# Patient Record
Sex: Female | Born: 1945 | Race: White | Hispanic: No
Health system: Midwestern US, Academic
[De-identification: ages and names within clinical notes are randomized; demographics above are authoritative.]

---

## 2017-06-20 ENCOUNTER — Encounter: Admit: 2017-06-20 | Discharge: 2017-06-20 | Payer: BC Managed Care – PPO

## 2017-06-20 ENCOUNTER — Ambulatory Visit: Admit: 2017-06-20 | Discharge: 2017-06-20 | Payer: BC Managed Care – PPO

## 2017-06-20 DIAGNOSIS — M720 Palmar fascial fibromatosis [Dupuytren]: Principal | ICD-10-CM

## 2017-06-20 NOTE — Progress Notes
Date of Service: 06/20/2017    Subjective:               History of Present Illness  71 year old female with Dupuytren's nodules of the left palm and and right small finger.  She has no issues with wrist pain. ECU tendinosis has resolved. She notes increased pain on the left MP joint, as well as new nodule over small finger on the left. She would like to have these injected with steroids.     Review of Systems   Constitutional: Negative.    HENT: Negative.    Eyes: Negative.    Respiratory: Negative.    Cardiovascular: Negative.    Gastrointestinal: Negative.    Endocrine: Negative.    Genitourinary: Negative.    Musculoskeletal: Negative.         As per HPI   Skin: Negative.    Allergic/Immunologic: Negative.    Neurological: Negative.    Hematological: Negative.    Psychiatric/Behavioral: Negative.    All other systems reviewed and are negative.      Objective:         ??? ACCU-CHEK AVIVA CONNECT METER kit USE AS DIRECTED TO CHECK BLOOD GLUCOSE DAILY   ??? ACCU-CHEK AVIVA PLUS TEST STRP test strip USE TO CHECK BLOOD GLUCOSE DAILY   ??? ascorbic acid (VITAMIN C) 500 mg tablet Daily   ??? aspirin EC 81 mg tablet Daily   ??? atorvastatin (LIPITOR) 20 mg tablet Daily   ??? bumetanide (BUMEX) 1 mg tablet Take  by mouth.   ??? Cyanocobalamin 1,000 mcg subl Daily   ??? diclofenac(+) (VOLTAREN) 1 % topical gel Four Times Daily   ??? fluticasone (FLONASE) 50 mcg/actuation nasal spray Daily   ??? gabapentin (NEURONTIN) 600 mg tablet Take As Directed   ??? levothyroxine (SYNTHROID) 100 mcg tablet    ??? lisinopril (PRINIVIL; ZESTRIL) 10 mg tablet Take  by mouth.   ??? loratadine (CLARITIN) 10 mg tablet Take  by mouth.   ??? metFORMIN-XR(+) (GLUCOPHAGE XR) 500 mg extended release tablet    ??? nitroglycerin (NITROSTAT) 0.4 mg tablet As Directed for For Chest Pain   ??? oxybutynin XL (DITROPAN XL) 5 mg tablet    ??? pramipexole (MIRAPEX) 0.5 mg tablet      Vitals:    06/20/17 1443   BP: 122/67   Pulse: 57   Weight: 84.8 kg (187 lb)   Height: 157.5 cm (62) Body mass index is 34.2 kg/m???.     Physical Exam   Constitutional: She is oriented to person, place, and time. She appears well-developed and well-nourished.   HENT:   Head: Normocephalic and atraumatic.   Eyes: Conjunctivae are normal.   Cardiovascular: Normal rate.    Pulmonary/Chest: Effort normal.   Musculoskeletal:   Stable left palmar nodules/cords. Able to fully extend all digits. WWP. SILT.   No pain/tenderness ulnar wrist. TTP L small finger just proximal to MP joint.     Neurological: She is alert and oriented to person, place, and time.   Skin: Skin is warm and dry.   Psychiatric: She has a normal mood and affect. Her behavior is normal. Judgment and thought content normal.   Nursing note and vitals reviewed.    Awake and alert, NAD  Breathing comfortably on RA  Well perfused    Assessment and Plan:  56F with Dupuytren's disease. Steroid injections performed today. She tolerated the procedure well. There were no complications.  20 mg right ulnar side small finger, 10  mg ulnar side mp joint, small finger. F/U in 6 months, sooner if problems or issues arise. All of her questions were answered.

## 2017-06-20 NOTE — Procedures
The risks of steroid injection were extensively discussed with the patient.  These include but are not limited to pain, scarring, soft tissue atrophy/thinning, skin color changes and hypopigmentation, skin quality changes and telengectasias, injury to nearby structures such as vessels, and nerves, tendon injury and tendon rupture and need for further procedures. Nerve injury may result in temporary or permanent numbness. Vessel injury may result in hand or finger ischemia.   The patient understood these and wished to proceed. A witnessed written consent was obtained.      In an aseptic fashion, a total of  20 mg right ulnar side small finger, 10 mg ulnar side mp joint, small finger.     The kenalog 40 was mixed in a 1:1 fashion with 1% lidocaine. There were no complications. A sterile dressing was applied.    The patient tolerated the procedure well. Post-injection expectations, limitations of the injection and care of the extremity were discussed.  Follow up plan was reviewed.

## 2017-08-13 ENCOUNTER — Encounter: Admit: 2017-08-13 | Discharge: 2017-08-14 | Payer: BC Managed Care – PPO

## 2017-09-21 ENCOUNTER — Encounter: Admit: 2017-09-21 | Discharge: 2017-09-22 | Payer: BC Managed Care – PPO

## 2017-10-05 ENCOUNTER — Encounter: Admit: 2017-10-05 | Discharge: 2017-10-06 | Payer: BC Managed Care – PPO

## 2017-10-08 ENCOUNTER — Encounter: Admit: 2017-10-08 | Discharge: 2017-10-09 | Payer: BC Managed Care – PPO

## 2017-10-11 ENCOUNTER — Encounter: Admit: 2017-10-11 | Discharge: 2017-10-11 | Payer: BC Managed Care – PPO

## 2017-10-12 ENCOUNTER — Encounter: Admit: 2017-10-12 | Discharge: 2017-10-12 | Payer: BC Managed Care – PPO

## 2017-10-12 DIAGNOSIS — C50912 Malignant neoplasm of unspecified site of left female breast: Principal | ICD-10-CM

## 2017-10-12 DIAGNOSIS — D0512 Intraductal carcinoma in situ of left breast: ICD-10-CM

## 2017-10-14 ENCOUNTER — Encounter: Admit: 2017-10-14 | Discharge: 2017-10-14 | Payer: BC Managed Care – PPO

## 2017-10-14 DIAGNOSIS — M720 Palmar fascial fibromatosis [Dupuytren]: Principal | ICD-10-CM

## 2017-10-16 ENCOUNTER — Encounter: Admit: 2017-10-16 | Discharge: 2017-10-17 | Payer: BC Managed Care – PPO

## 2017-10-16 ENCOUNTER — Encounter: Admit: 2017-10-16 | Discharge: 2017-10-16 | Payer: BC Managed Care – PPO

## 2017-10-16 DIAGNOSIS — C50412 Malignant neoplasm of upper-outer quadrant of left female breast: Principal | ICD-10-CM

## 2017-10-16 DIAGNOSIS — B029 Zoster without complications: ICD-10-CM

## 2017-10-16 DIAGNOSIS — D869 Sarcoidosis, unspecified: ICD-10-CM

## 2017-10-16 DIAGNOSIS — Z170 Estrogen receptor positive status [ER+]: ICD-10-CM

## 2017-10-16 DIAGNOSIS — C50912 Malignant neoplasm of unspecified site of left female breast: ICD-10-CM

## 2017-10-16 DIAGNOSIS — D0512 Intraductal carcinoma in situ of left breast: ICD-10-CM

## 2017-10-16 DIAGNOSIS — N3941 Urge incontinence: ICD-10-CM

## 2017-10-16 DIAGNOSIS — H547 Unspecified visual loss: ICD-10-CM

## 2017-10-16 DIAGNOSIS — R931 Abnormal findings on diagnostic imaging of heart and coronary circulation: ICD-10-CM

## 2017-10-16 DIAGNOSIS — M25561 Pain in right knee: ICD-10-CM

## 2017-10-16 DIAGNOSIS — G4730 Sleep apnea, unspecified: ICD-10-CM

## 2017-10-16 DIAGNOSIS — K760 Fatty (change of) liver, not elsewhere classified: ICD-10-CM

## 2017-10-16 DIAGNOSIS — Z01818 Encounter for other preprocedural examination: Secondary | ICD-10-CM

## 2017-10-16 DIAGNOSIS — I10 Essential (primary) hypertension: ICD-10-CM

## 2017-10-16 DIAGNOSIS — J329 Chronic sinusitis, unspecified: ICD-10-CM

## 2017-10-16 DIAGNOSIS — E069 Thyroiditis, unspecified: ICD-10-CM

## 2017-10-16 DIAGNOSIS — M549 Dorsalgia, unspecified: ICD-10-CM

## 2017-10-16 DIAGNOSIS — N200 Calculus of kidney: ICD-10-CM

## 2017-10-16 DIAGNOSIS — E039 Hypothyroidism, unspecified: ICD-10-CM

## 2017-10-16 DIAGNOSIS — E119 Type 2 diabetes mellitus without complications: ICD-10-CM

## 2017-10-16 DIAGNOSIS — C50919 Malignant neoplasm of unspecified site of unspecified female breast: ICD-10-CM

## 2017-10-16 DIAGNOSIS — M545 Low back pain: ICD-10-CM

## 2017-10-16 DIAGNOSIS — M720 Palmar fascial fibromatosis [Dupuytren]: Principal | ICD-10-CM

## 2017-10-16 LAB — COMPREHENSIVE METABOLIC PANEL
Lab: 0.4 mg/dL (ref 0.3–1.2)
Lab: 102 MMOL/L (ref 98–110)
Lab: 11 U/L (ref 7–56)
Lab: 135 MMOL/L — ABNORMAL LOW (ref 137–147)
Lab: 16 U/L (ref 7–40)
Lab: 20 mg/dL (ref 7–25)
Lab: 3 K/UL (ref 3–12)
Lab: 3.9 g/dL (ref 3.5–5.0)
Lab: 30 MMOL/L (ref 21–30)
Lab: 4.2 MMOL/L — ABNORMAL LOW (ref 3.5–5.1)
Lab: 65 U/L (ref 25–110)
Lab: 9.3 mg/dL (ref 8.5–10.6)
Lab: 96 mg/dL (ref 70–100)
Lab: >60 mL/min (ref >60)
Lab: >60 mL/min (ref >60)

## 2017-10-16 LAB — CBC AND DIFF
Lab: 0.1 10*3/uL (ref 0–0.20)
Lab: 3.9 M/UL — ABNORMAL LOW (ref 4.0–5.0)
Lab: 8 10*3/uL (ref 4.5–11.0)

## 2017-10-16 MED ORDER — CEFAZOLIN INJ 1GM IVP
2 g | Freq: Once | INTRAVENOUS | 0 refills | Status: CN
Start: 2017-10-16 — End: ?

## 2017-10-17 ENCOUNTER — Encounter: Admit: 2017-10-17 | Discharge: 2017-10-17 | Payer: BC Managed Care – PPO

## 2017-10-17 DIAGNOSIS — C50912 Malignant neoplasm of unspecified site of left female breast: Principal | ICD-10-CM

## 2017-10-17 DIAGNOSIS — C50412 Malignant neoplasm of upper-outer quadrant of left female breast: Principal | ICD-10-CM

## 2017-10-18 ENCOUNTER — Encounter: Admit: 2017-10-18 | Discharge: 2017-10-18 | Payer: BC Managed Care – PPO

## 2017-10-19 ENCOUNTER — Encounter: Admit: 2017-10-19 | Discharge: 2017-10-19 | Payer: BC Managed Care – PPO

## 2017-10-19 ENCOUNTER — Ambulatory Visit: Admit: 2017-10-19 | Discharge: 2017-10-19 | Payer: BC Managed Care – PPO

## 2017-10-19 ENCOUNTER — Ambulatory Visit: Admit: 2017-10-19 | Discharge: 2017-10-20 | Payer: BC Managed Care – PPO

## 2017-10-19 DIAGNOSIS — C50912 Malignant neoplasm of unspecified site of left female breast: ICD-10-CM

## 2017-10-19 DIAGNOSIS — I10 Essential (primary) hypertension: ICD-10-CM

## 2017-10-19 DIAGNOSIS — C50919 Malignant neoplasm of unspecified site of unspecified female breast: ICD-10-CM

## 2017-10-19 DIAGNOSIS — E069 Thyroiditis, unspecified: ICD-10-CM

## 2017-10-19 DIAGNOSIS — E039 Hypothyroidism, unspecified: ICD-10-CM

## 2017-10-19 DIAGNOSIS — Z803 Family history of malignant neoplasm of breast: ICD-10-CM

## 2017-10-19 DIAGNOSIS — B029 Zoster without complications: ICD-10-CM

## 2017-10-19 DIAGNOSIS — C50412 Malignant neoplasm of upper-outer quadrant of left female breast: Principal | ICD-10-CM

## 2017-10-19 DIAGNOSIS — Z170 Estrogen receptor positive status [ER+]: ICD-10-CM

## 2017-10-19 DIAGNOSIS — M25561 Pain in right knee: ICD-10-CM

## 2017-10-19 DIAGNOSIS — H547 Unspecified visual loss: ICD-10-CM

## 2017-10-19 DIAGNOSIS — M720 Palmar fascial fibromatosis [Dupuytren]: Principal | ICD-10-CM

## 2017-10-19 DIAGNOSIS — D869 Sarcoidosis, unspecified: ICD-10-CM

## 2017-10-19 DIAGNOSIS — E119 Type 2 diabetes mellitus without complications: ICD-10-CM

## 2017-10-19 DIAGNOSIS — R931 Abnormal findings on diagnostic imaging of heart and coronary circulation: ICD-10-CM

## 2017-10-19 DIAGNOSIS — N3941 Urge incontinence: ICD-10-CM

## 2017-10-19 DIAGNOSIS — N200 Calculus of kidney: ICD-10-CM

## 2017-10-19 DIAGNOSIS — K760 Fatty (change of) liver, not elsewhere classified: ICD-10-CM

## 2017-10-19 DIAGNOSIS — G4730 Sleep apnea, unspecified: ICD-10-CM

## 2017-10-19 DIAGNOSIS — J329 Chronic sinusitis, unspecified: ICD-10-CM

## 2017-10-19 DIAGNOSIS — M549 Dorsalgia, unspecified: ICD-10-CM

## 2017-10-19 DIAGNOSIS — M545 Low back pain: ICD-10-CM

## 2017-10-19 LAB — MISC REFERENCE TEST

## 2017-10-19 MED ORDER — LIDOCAINE 1% (BUFFERED) SYRINGE (BREAST CENTER)
2 mL | Freq: Once | INTRAMUSCULAR | 0 refills | Status: CP
Start: 2017-10-19 — End: ?

## 2017-10-22 ENCOUNTER — Ambulatory Visit: Admit: 2017-10-22 | Discharge: 2017-10-23 | Payer: BC Managed Care – PPO

## 2017-10-22 ENCOUNTER — Encounter: Admit: 2017-10-22 | Discharge: 2017-10-22 | Payer: BC Managed Care – PPO

## 2017-10-22 DIAGNOSIS — B029 Zoster without complications: ICD-10-CM

## 2017-10-22 DIAGNOSIS — M1990 Unspecified osteoarthritis, unspecified site: ICD-10-CM

## 2017-10-22 DIAGNOSIS — G4733 Obstructive sleep apnea (adult) (pediatric): ICD-10-CM

## 2017-10-22 DIAGNOSIS — R112 Nausea with vomiting, unspecified: ICD-10-CM

## 2017-10-22 DIAGNOSIS — K760 Fatty (change of) liver, not elsewhere classified: ICD-10-CM

## 2017-10-22 DIAGNOSIS — M47812 Spondylosis without myelopathy or radiculopathy, cervical region: ICD-10-CM

## 2017-10-22 DIAGNOSIS — R931 Abnormal findings on diagnostic imaging of heart and coronary circulation: ICD-10-CM

## 2017-10-22 DIAGNOSIS — N3941 Urge incontinence: ICD-10-CM

## 2017-10-22 DIAGNOSIS — I5189 Other ill-defined heart diseases: ICD-10-CM

## 2017-10-22 DIAGNOSIS — M5020 Other cervical disc displacement, unspecified cervical region: ICD-10-CM

## 2017-10-22 DIAGNOSIS — E785 Hyperlipidemia, unspecified: ICD-10-CM

## 2017-10-22 DIAGNOSIS — J479 Bronchiectasis, uncomplicated: ICD-10-CM

## 2017-10-22 DIAGNOSIS — I10 Essential (primary) hypertension: ICD-10-CM

## 2017-10-22 DIAGNOSIS — D869 Sarcoidosis, unspecified: ICD-10-CM

## 2017-10-22 DIAGNOSIS — M545 Low back pain: ICD-10-CM

## 2017-10-22 DIAGNOSIS — M25561 Pain in right knee: ICD-10-CM

## 2017-10-22 DIAGNOSIS — E039 Hypothyroidism, unspecified: ICD-10-CM

## 2017-10-22 DIAGNOSIS — J329 Chronic sinusitis, unspecified: ICD-10-CM

## 2017-10-22 DIAGNOSIS — C50919 Malignant neoplasm of unspecified site of unspecified female breast: ICD-10-CM

## 2017-10-22 DIAGNOSIS — E119 Type 2 diabetes mellitus without complications: ICD-10-CM

## 2017-10-22 DIAGNOSIS — M720 Palmar fascial fibromatosis [Dupuytren]: Principal | ICD-10-CM

## 2017-10-22 DIAGNOSIS — E069 Thyroiditis, unspecified: ICD-10-CM

## 2017-10-22 DIAGNOSIS — H547 Unspecified visual loss: ICD-10-CM

## 2017-10-22 DIAGNOSIS — N200 Calculus of kidney: ICD-10-CM

## 2017-10-23 ENCOUNTER — Encounter: Admit: 2017-10-23 | Discharge: 2017-10-23 | Payer: BC Managed Care – PPO

## 2017-10-23 DIAGNOSIS — Z01810 Encounter for preprocedural cardiovascular examination: Principal | ICD-10-CM

## 2017-10-24 ENCOUNTER — Encounter: Admit: 2017-10-24 | Discharge: 2017-10-24 | Payer: BC Managed Care – PPO

## 2017-10-24 ENCOUNTER — Ambulatory Visit: Admit: 2017-10-24 | Discharge: 2017-10-24 | Payer: BC Managed Care – PPO

## 2017-10-24 ENCOUNTER — Ambulatory Visit: Admit: 2017-10-22 | Discharge: 2017-10-22 | Payer: BC Managed Care – PPO

## 2017-10-24 DIAGNOSIS — D869 Sarcoidosis, unspecified: ICD-10-CM

## 2017-10-24 DIAGNOSIS — E069 Thyroiditis, unspecified: ICD-10-CM

## 2017-10-24 DIAGNOSIS — Z170 Estrogen receptor positive status [ER+]: ICD-10-CM

## 2017-10-24 DIAGNOSIS — Z800 Family history of malignant neoplasm of digestive organs: ICD-10-CM

## 2017-10-24 DIAGNOSIS — Z881 Allergy status to other antibiotic agents status: ICD-10-CM

## 2017-10-24 DIAGNOSIS — I5189 Other ill-defined heart diseases: ICD-10-CM

## 2017-10-24 DIAGNOSIS — G4733 Obstructive sleep apnea (adult) (pediatric): ICD-10-CM

## 2017-10-24 DIAGNOSIS — E039 Hypothyroidism, unspecified: ICD-10-CM

## 2017-10-24 DIAGNOSIS — Z803 Family history of malignant neoplasm of breast: ICD-10-CM

## 2017-10-24 DIAGNOSIS — Z8249 Family history of ischemic heart disease and other diseases of the circulatory system: ICD-10-CM

## 2017-10-24 DIAGNOSIS — Z885 Allergy status to narcotic agent status: ICD-10-CM

## 2017-10-24 DIAGNOSIS — M545 Low back pain: ICD-10-CM

## 2017-10-24 DIAGNOSIS — M5020 Other cervical disc displacement, unspecified cervical region: ICD-10-CM

## 2017-10-24 DIAGNOSIS — M25561 Pain in right knee: ICD-10-CM

## 2017-10-24 DIAGNOSIS — C50919 Malignant neoplasm of unspecified site of unspecified female breast: ICD-10-CM

## 2017-10-24 DIAGNOSIS — I10 Essential (primary) hypertension: ICD-10-CM

## 2017-10-24 DIAGNOSIS — E119 Type 2 diabetes mellitus without complications: ICD-10-CM

## 2017-10-24 DIAGNOSIS — M47812 Spondylosis without myelopathy or radiculopathy, cervical region: ICD-10-CM

## 2017-10-24 DIAGNOSIS — E785 Hyperlipidemia, unspecified: ICD-10-CM

## 2017-10-24 DIAGNOSIS — N200 Calculus of kidney: ICD-10-CM

## 2017-10-24 DIAGNOSIS — M1990 Unspecified osteoarthritis, unspecified site: ICD-10-CM

## 2017-10-24 DIAGNOSIS — H547 Unspecified visual loss: ICD-10-CM

## 2017-10-24 DIAGNOSIS — C50412 Malignant neoplasm of upper-outer quadrant of left female breast: Principal | ICD-10-CM

## 2017-10-24 DIAGNOSIS — M720 Palmar fascial fibromatosis [Dupuytren]: Principal | ICD-10-CM

## 2017-10-24 DIAGNOSIS — B029 Zoster without complications: ICD-10-CM

## 2017-10-24 DIAGNOSIS — R112 Nausea with vomiting, unspecified: ICD-10-CM

## 2017-10-24 DIAGNOSIS — N3941 Urge incontinence: ICD-10-CM

## 2017-10-24 DIAGNOSIS — J479 Bronchiectasis, uncomplicated: ICD-10-CM

## 2017-10-24 DIAGNOSIS — R931 Abnormal findings on diagnostic imaging of heart and coronary circulation: ICD-10-CM

## 2017-10-24 DIAGNOSIS — J329 Chronic sinusitis, unspecified: ICD-10-CM

## 2017-10-24 DIAGNOSIS — K760 Fatty (change of) liver, not elsewhere classified: Secondary | ICD-10-CM

## 2017-10-24 LAB — POC GLUCOSE
Lab: 99 mg/dL (ref 70–100)
Lab: 99 mg/dL (ref 70–100)

## 2017-10-24 MED ORDER — PROPOFOL 10 MG/ML IV EMUL 50 ML (INFUSION)(AM)(OR)
INTRAVENOUS | 0 refills | Status: DC
Start: 2017-10-24 — End: 2017-10-24
  Administered 2017-10-24: 15:00:00 125 ug/kg/min via INTRAVENOUS

## 2017-10-24 MED ORDER — MIDAZOLAM 1 MG/ML IJ SOLN
INTRAVENOUS | 0 refills | Status: DC
Start: 2017-10-24 — End: 2017-10-24
  Administered 2017-10-24: 15:00:00 2 mg via INTRAVENOUS

## 2017-10-24 MED ORDER — OXYCODONE-ACETAMINOPHEN 5-325 MG PO TAB
1-2 | ORAL | 0 refills | Status: DC | PRN
Start: 2017-10-24 — End: 2017-10-24

## 2017-10-24 MED ORDER — LIDOCAINE (PF) 10 MG/ML (1 %) IJ SOLN
.1-2 mL | Freq: Once | INTRAMUSCULAR | 0 refills | Status: CP
Start: 2017-10-24 — End: ?

## 2017-10-24 MED ORDER — SENNOSIDES 8.6 MG PO TAB
2 | ORAL_TABLET | Freq: Every evening | ORAL | 1 refills | Status: AC | PRN
Start: 2017-10-24 — End: 2017-11-06

## 2017-10-24 MED ORDER — FENTANYL CITRATE (PF) 50 MCG/ML IJ SOLN
50 ug | INTRAVENOUS | 0 refills | Status: DC | PRN
Start: 2017-10-24 — End: 2017-10-24

## 2017-10-24 MED ORDER — LACTATED RINGERS IV SOLP
INTRAVENOUS | 0 refills | Status: DC
Start: 2017-10-24 — End: 2017-10-24
  Administered 2017-10-24: 15:00:00 1000.000 mL via INTRAVENOUS

## 2017-10-24 MED ORDER — LIDOCAINE-EPINEPHRINE 1 %-1:100,000 IJ SOLN
0 refills | Status: DC
Start: 2017-10-24 — End: 2017-10-24
  Administered 2017-10-24: 16:00:00 13 mL via INTRAMUSCULAR

## 2017-10-24 MED ORDER — PROMETHAZINE 25 MG/ML IJ SOLN
6.25 mg | INTRAVENOUS | 0 refills | Status: DC | PRN
Start: 2017-10-24 — End: 2017-10-24

## 2017-10-24 MED ORDER — OXYCODONE-ACETAMINOPHEN 5-325 MG PO TAB
1 | ORAL_TABLET | ORAL | 0 refills | 2.00000 days | Status: AC | PRN
Start: 2017-10-24 — End: 2017-11-06
  Filled 2017-10-24 (×2): qty 32, 7d supply, fill #1

## 2017-10-24 MED ORDER — CEFAZOLIN INJ 1GM IVP
2 g | Freq: Once | INTRAVENOUS | 0 refills | Status: CP
Start: 2017-10-24 — End: ?
  Administered 2017-10-24: 15:00:00 2 g via INTRAVENOUS

## 2017-10-24 MED ORDER — FENTANYL CITRATE (PF) 50 MCG/ML IJ SOLN
25 ug | INTRAVENOUS | 0 refills | Status: DC | PRN
Start: 2017-10-24 — End: 2017-10-24

## 2017-10-24 MED ORDER — HALOPERIDOL LACTATE 5 MG/ML IJ SOLN
1 mg | Freq: Once | INTRAVENOUS | 0 refills | Status: DC | PRN
Start: 2017-10-24 — End: 2017-10-24

## 2017-10-24 MED ORDER — HYDROCODONE-ACETAMINOPHEN 5-325 MG PO TAB
1 | ORAL_TABLET | ORAL | 0 refills | Status: CN | PRN
Start: 2017-10-24 — End: ?

## 2017-10-24 MED ORDER — BUPIVACAINE 0.25 % (2.5 MG/ML) IJ SOLN
0 refills | Status: DC
Start: 2017-10-24 — End: 2017-10-24
  Administered 2017-10-24: 16:00:00 20 mL via INTRAMUSCULAR

## 2017-10-24 MED ORDER — LIDOCAINE (PF) 200 MG/10 ML (2 %) IJ SYRG
0 refills | Status: DC
Start: 2017-10-24 — End: 2017-10-24
  Administered 2017-10-24: 15:00:00 50 mg via INTRAVENOUS

## 2017-10-24 MED ORDER — MEPERIDINE (PF) 25 MG/ML IJ SYRG
12.5 mg | INTRAVENOUS | 0 refills | Status: DC | PRN
Start: 2017-10-24 — End: 2017-10-24

## 2017-10-24 MED ADMIN — LIDOCAINE (PF) 10 MG/ML (1 %) IJ SOLN [95838]: 0.2 mL | INTRAMUSCULAR | @ 14:00:00 | Stop: 2017-10-24 | NDC 63323049227

## 2017-10-26 ENCOUNTER — Encounter: Admit: 2017-10-26 | Discharge: 2017-10-26 | Payer: BC Managed Care – PPO

## 2017-10-26 DIAGNOSIS — B029 Zoster without complications: ICD-10-CM

## 2017-10-26 DIAGNOSIS — M5020 Other cervical disc displacement, unspecified cervical region: ICD-10-CM

## 2017-10-26 DIAGNOSIS — K760 Fatty (change of) liver, not elsewhere classified: ICD-10-CM

## 2017-10-26 DIAGNOSIS — M47812 Spondylosis without myelopathy or radiculopathy, cervical region: ICD-10-CM

## 2017-10-26 DIAGNOSIS — E039 Hypothyroidism, unspecified: Secondary | ICD-10-CM

## 2017-10-26 DIAGNOSIS — M720 Palmar fascial fibromatosis [Dupuytren]: Principal | ICD-10-CM

## 2017-10-26 DIAGNOSIS — J479 Bronchiectasis, uncomplicated: ICD-10-CM

## 2017-10-26 DIAGNOSIS — M25561 Pain in right knee: ICD-10-CM

## 2017-10-26 DIAGNOSIS — D869 Sarcoidosis, unspecified: ICD-10-CM

## 2017-10-26 DIAGNOSIS — N3941 Urge incontinence: ICD-10-CM

## 2017-10-26 DIAGNOSIS — I10 Essential (primary) hypertension: ICD-10-CM

## 2017-10-26 DIAGNOSIS — E785 Hyperlipidemia, unspecified: ICD-10-CM

## 2017-10-26 DIAGNOSIS — R931 Abnormal findings on diagnostic imaging of heart and coronary circulation: ICD-10-CM

## 2017-10-26 DIAGNOSIS — M545 Low back pain: ICD-10-CM

## 2017-10-26 DIAGNOSIS — E119 Type 2 diabetes mellitus without complications: ICD-10-CM

## 2017-10-26 DIAGNOSIS — E069 Thyroiditis, unspecified: ICD-10-CM

## 2017-10-26 DIAGNOSIS — G4733 Obstructive sleep apnea (adult) (pediatric): ICD-10-CM

## 2017-10-26 DIAGNOSIS — I5189 Other ill-defined heart diseases: ICD-10-CM

## 2017-10-26 DIAGNOSIS — J329 Chronic sinusitis, unspecified: ICD-10-CM

## 2017-10-26 DIAGNOSIS — H547 Unspecified visual loss: ICD-10-CM

## 2017-10-26 DIAGNOSIS — R112 Nausea with vomiting, unspecified: ICD-10-CM

## 2017-10-26 DIAGNOSIS — C50919 Malignant neoplasm of unspecified site of unspecified female breast: ICD-10-CM

## 2017-10-26 DIAGNOSIS — M1990 Unspecified osteoarthritis, unspecified site: ICD-10-CM

## 2017-10-26 DIAGNOSIS — N200 Calculus of kidney: ICD-10-CM

## 2017-10-30 ENCOUNTER — Ambulatory Visit: Admit: 2017-10-30 | Discharge: 2017-11-13 | Payer: BC Managed Care – PPO

## 2017-10-30 DIAGNOSIS — Z170 Estrogen receptor positive status [ER+]: Secondary | ICD-10-CM

## 2017-11-01 ENCOUNTER — Encounter: Admit: 2017-11-01 | Discharge: 2017-11-01 | Payer: BC Managed Care – PPO

## 2017-11-06 ENCOUNTER — Encounter: Admit: 2017-11-06 | Discharge: 2017-11-06 | Payer: BC Managed Care – PPO

## 2017-11-06 DIAGNOSIS — I10 Essential (primary) hypertension: ICD-10-CM

## 2017-11-06 DIAGNOSIS — H547 Unspecified visual loss: ICD-10-CM

## 2017-11-06 DIAGNOSIS — M47812 Spondylosis without myelopathy or radiculopathy, cervical region: ICD-10-CM

## 2017-11-06 DIAGNOSIS — Z79811 Long term (current) use of aromatase inhibitors: ICD-10-CM

## 2017-11-06 DIAGNOSIS — B029 Zoster without complications: ICD-10-CM

## 2017-11-06 DIAGNOSIS — R931 Abnormal findings on diagnostic imaging of heart and coronary circulation: ICD-10-CM

## 2017-11-06 DIAGNOSIS — E039 Hypothyroidism, unspecified: Secondary | ICD-10-CM

## 2017-11-06 DIAGNOSIS — M1990 Unspecified osteoarthritis, unspecified site: ICD-10-CM

## 2017-11-06 DIAGNOSIS — Z9889 Other specified postprocedural states: ICD-10-CM

## 2017-11-06 DIAGNOSIS — D869 Sarcoidosis, unspecified: ICD-10-CM

## 2017-11-06 DIAGNOSIS — C50412 Malignant neoplasm of upper-outer quadrant of left female breast: Principal | ICD-10-CM

## 2017-11-06 DIAGNOSIS — K760 Fatty (change of) liver, not elsewhere classified: ICD-10-CM

## 2017-11-06 DIAGNOSIS — M25561 Pain in right knee: ICD-10-CM

## 2017-11-06 DIAGNOSIS — N3941 Urge incontinence: ICD-10-CM

## 2017-11-06 DIAGNOSIS — Z170 Estrogen receptor positive status [ER+]: ICD-10-CM

## 2017-11-06 DIAGNOSIS — M545 Low back pain: ICD-10-CM

## 2017-11-06 DIAGNOSIS — M5020 Other cervical disc displacement, unspecified cervical region: ICD-10-CM

## 2017-11-06 DIAGNOSIS — J479 Bronchiectasis, uncomplicated: ICD-10-CM

## 2017-11-06 DIAGNOSIS — E069 Thyroiditis, unspecified: ICD-10-CM

## 2017-11-06 DIAGNOSIS — R112 Nausea with vomiting, unspecified: ICD-10-CM

## 2017-11-06 DIAGNOSIS — Z09 Encounter for follow-up examination after completed treatment for conditions other than malignant neoplasm: ICD-10-CM

## 2017-11-06 DIAGNOSIS — J329 Chronic sinusitis, unspecified: ICD-10-CM

## 2017-11-06 DIAGNOSIS — C50919 Malignant neoplasm of unspecified site of unspecified female breast: ICD-10-CM

## 2017-11-06 DIAGNOSIS — E119 Type 2 diabetes mellitus without complications: ICD-10-CM

## 2017-11-06 DIAGNOSIS — M720 Palmar fascial fibromatosis [Dupuytren]: Principal | ICD-10-CM

## 2017-11-06 DIAGNOSIS — E785 Hyperlipidemia, unspecified: ICD-10-CM

## 2017-11-06 DIAGNOSIS — I5189 Other ill-defined heart diseases: ICD-10-CM

## 2017-11-06 DIAGNOSIS — G4733 Obstructive sleep apnea (adult) (pediatric): ICD-10-CM

## 2017-11-06 DIAGNOSIS — N200 Calculus of kidney: ICD-10-CM

## 2017-11-06 MED ORDER — ANASTROZOLE 1 MG PO TAB
1 mg | ORAL_TABLET | Freq: Every day | ORAL | 3 refills | 33.00000 days | Status: AC
Start: 2017-11-06 — End: 2018-12-25

## 2017-11-12 ENCOUNTER — Encounter: Admit: 2017-11-12 | Discharge: 2017-11-12 | Payer: BC Managed Care – PPO

## 2017-11-12 ENCOUNTER — Ambulatory Visit: Admit: 2017-11-12 | Discharge: 2017-11-12 | Payer: BC Managed Care – PPO

## 2017-11-12 DIAGNOSIS — J479 Bronchiectasis, uncomplicated: ICD-10-CM

## 2017-11-12 DIAGNOSIS — E039 Hypothyroidism, unspecified: ICD-10-CM

## 2017-11-12 DIAGNOSIS — M47812 Spondylosis without myelopathy or radiculopathy, cervical region: ICD-10-CM

## 2017-11-12 DIAGNOSIS — M1990 Unspecified osteoarthritis, unspecified site: ICD-10-CM

## 2017-11-12 DIAGNOSIS — C50919 Malignant neoplasm of unspecified site of unspecified female breast: ICD-10-CM

## 2017-11-12 DIAGNOSIS — E069 Thyroiditis, unspecified: ICD-10-CM

## 2017-11-12 DIAGNOSIS — R931 Abnormal findings on diagnostic imaging of heart and coronary circulation: ICD-10-CM

## 2017-11-12 DIAGNOSIS — R112 Nausea with vomiting, unspecified: ICD-10-CM

## 2017-11-12 DIAGNOSIS — G4733 Obstructive sleep apnea (adult) (pediatric): ICD-10-CM

## 2017-11-12 DIAGNOSIS — D869 Sarcoidosis, unspecified: ICD-10-CM

## 2017-11-12 DIAGNOSIS — M5020 Other cervical disc displacement, unspecified cervical region: ICD-10-CM

## 2017-11-12 DIAGNOSIS — I5189 Other ill-defined heart diseases: ICD-10-CM

## 2017-11-12 DIAGNOSIS — N3941 Urge incontinence: ICD-10-CM

## 2017-11-12 DIAGNOSIS — M720 Palmar fascial fibromatosis [Dupuytren]: Principal | ICD-10-CM

## 2017-11-12 DIAGNOSIS — J329 Chronic sinusitis, unspecified: ICD-10-CM

## 2017-11-12 DIAGNOSIS — B029 Zoster without complications: ICD-10-CM

## 2017-11-12 DIAGNOSIS — E785 Hyperlipidemia, unspecified: ICD-10-CM

## 2017-11-12 DIAGNOSIS — N200 Calculus of kidney: ICD-10-CM

## 2017-11-12 DIAGNOSIS — C50412 Malignant neoplasm of upper-outer quadrant of left female breast: Principal | ICD-10-CM

## 2017-11-12 DIAGNOSIS — M25561 Pain in right knee: ICD-10-CM

## 2017-11-12 DIAGNOSIS — I10 Essential (primary) hypertension: ICD-10-CM

## 2017-11-12 DIAGNOSIS — H547 Unspecified visual loss: ICD-10-CM

## 2017-11-12 DIAGNOSIS — E119 Type 2 diabetes mellitus without complications: ICD-10-CM

## 2017-11-12 DIAGNOSIS — M545 Low back pain: ICD-10-CM

## 2017-11-12 DIAGNOSIS — K760 Fatty (change of) liver, not elsewhere classified: Secondary | ICD-10-CM

## 2017-11-13 DIAGNOSIS — Z170 Estrogen receptor positive status [ER+]: ICD-10-CM

## 2017-11-13 DIAGNOSIS — C50412 Malignant neoplasm of upper-outer quadrant of left female breast: Principal | ICD-10-CM

## 2017-11-16 ENCOUNTER — Encounter: Admit: 2017-11-16 | Discharge: 2017-11-16 | Payer: BC Managed Care – PPO

## 2017-11-16 DIAGNOSIS — C50412 Malignant neoplasm of upper-outer quadrant of left female breast: Principal | ICD-10-CM

## 2017-11-16 DIAGNOSIS — Z803 Family history of malignant neoplasm of breast: ICD-10-CM

## 2017-11-18 ENCOUNTER — Encounter: Admit: 2017-11-18 | Discharge: 2017-11-18 | Payer: BC Managed Care – PPO

## 2017-11-18 DIAGNOSIS — M545 Low back pain: ICD-10-CM

## 2017-11-18 DIAGNOSIS — M1990 Unspecified osteoarthritis, unspecified site: ICD-10-CM

## 2017-11-18 DIAGNOSIS — J329 Chronic sinusitis, unspecified: ICD-10-CM

## 2017-11-18 DIAGNOSIS — C50919 Malignant neoplasm of unspecified site of unspecified female breast: ICD-10-CM

## 2017-11-18 DIAGNOSIS — N200 Calculus of kidney: ICD-10-CM

## 2017-11-18 DIAGNOSIS — R112 Nausea with vomiting, unspecified: ICD-10-CM

## 2017-11-18 DIAGNOSIS — K760 Fatty (change of) liver, not elsewhere classified: ICD-10-CM

## 2017-11-18 DIAGNOSIS — I10 Essential (primary) hypertension: ICD-10-CM

## 2017-11-18 DIAGNOSIS — G4733 Obstructive sleep apnea (adult) (pediatric): ICD-10-CM

## 2017-11-18 DIAGNOSIS — E039 Hypothyroidism, unspecified: Secondary | ICD-10-CM

## 2017-11-18 DIAGNOSIS — M5020 Other cervical disc displacement, unspecified cervical region: ICD-10-CM

## 2017-11-18 DIAGNOSIS — E785 Hyperlipidemia, unspecified: ICD-10-CM

## 2017-11-18 DIAGNOSIS — B029 Zoster without complications: ICD-10-CM

## 2017-11-18 DIAGNOSIS — M25561 Pain in right knee: ICD-10-CM

## 2017-11-18 DIAGNOSIS — I5189 Other ill-defined heart diseases: ICD-10-CM

## 2017-11-18 DIAGNOSIS — J479 Bronchiectasis, uncomplicated: ICD-10-CM

## 2017-11-18 DIAGNOSIS — M47812 Spondylosis without myelopathy or radiculopathy, cervical region: ICD-10-CM

## 2017-11-18 DIAGNOSIS — N3941 Urge incontinence: ICD-10-CM

## 2017-11-18 DIAGNOSIS — D869 Sarcoidosis, unspecified: ICD-10-CM

## 2017-11-18 DIAGNOSIS — R931 Abnormal findings on diagnostic imaging of heart and coronary circulation: ICD-10-CM

## 2017-11-18 DIAGNOSIS — H547 Unspecified visual loss: ICD-10-CM

## 2017-11-18 DIAGNOSIS — E069 Thyroiditis, unspecified: ICD-10-CM

## 2017-11-18 DIAGNOSIS — M720 Palmar fascial fibromatosis [Dupuytren]: Principal | ICD-10-CM

## 2017-11-18 DIAGNOSIS — E119 Type 2 diabetes mellitus without complications: ICD-10-CM

## 2017-11-23 ENCOUNTER — Encounter: Admit: 2017-11-23 | Discharge: 2017-11-23 | Payer: BC Managed Care – PPO

## 2017-12-12 ENCOUNTER — Encounter: Admit: 2017-12-12 | Discharge: 2017-12-12 | Payer: BC Managed Care – PPO

## 2017-12-19 ENCOUNTER — Encounter: Admit: 2017-12-19 | Discharge: 2017-12-19 | Payer: BC Managed Care – PPO

## 2017-12-19 ENCOUNTER — Ambulatory Visit: Admit: 2017-12-19 | Discharge: 2017-12-19 | Payer: BC Managed Care – PPO

## 2017-12-19 DIAGNOSIS — M25561 Pain in right knee: ICD-10-CM

## 2017-12-19 DIAGNOSIS — I10 Essential (primary) hypertension: ICD-10-CM

## 2017-12-19 DIAGNOSIS — M720 Palmar fascial fibromatosis [Dupuytren]: Principal | ICD-10-CM

## 2017-12-19 DIAGNOSIS — J329 Chronic sinusitis, unspecified: ICD-10-CM

## 2017-12-19 DIAGNOSIS — E039 Hypothyroidism, unspecified: ICD-10-CM

## 2017-12-19 DIAGNOSIS — D869 Sarcoidosis, unspecified: ICD-10-CM

## 2017-12-19 DIAGNOSIS — I5189 Other ill-defined heart diseases: ICD-10-CM

## 2017-12-19 DIAGNOSIS — M67442 Ganglion, left hand: ICD-10-CM

## 2017-12-19 DIAGNOSIS — G4733 Obstructive sleep apnea (adult) (pediatric): ICD-10-CM

## 2017-12-19 DIAGNOSIS — E069 Thyroiditis, unspecified: ICD-10-CM

## 2017-12-19 DIAGNOSIS — H547 Unspecified visual loss: ICD-10-CM

## 2017-12-19 DIAGNOSIS — N200 Calculus of kidney: ICD-10-CM

## 2017-12-19 DIAGNOSIS — E785 Hyperlipidemia, unspecified: ICD-10-CM

## 2017-12-19 DIAGNOSIS — N3941 Urge incontinence: ICD-10-CM

## 2017-12-19 DIAGNOSIS — B029 Zoster without complications: ICD-10-CM

## 2017-12-19 DIAGNOSIS — E119 Type 2 diabetes mellitus without complications: ICD-10-CM

## 2017-12-19 DIAGNOSIS — K760 Fatty (change of) liver, not elsewhere classified: Secondary | ICD-10-CM

## 2017-12-19 DIAGNOSIS — R931 Abnormal findings on diagnostic imaging of heart and coronary circulation: ICD-10-CM

## 2017-12-19 DIAGNOSIS — J479 Bronchiectasis, uncomplicated: ICD-10-CM

## 2017-12-19 DIAGNOSIS — M1990 Unspecified osteoarthritis, unspecified site: ICD-10-CM

## 2017-12-19 DIAGNOSIS — M5020 Other cervical disc displacement, unspecified cervical region: ICD-10-CM

## 2017-12-19 DIAGNOSIS — R112 Nausea with vomiting, unspecified: ICD-10-CM

## 2017-12-19 DIAGNOSIS — C50919 Malignant neoplasm of unspecified site of unspecified female breast: ICD-10-CM

## 2017-12-19 DIAGNOSIS — M545 Low back pain: ICD-10-CM

## 2017-12-19 DIAGNOSIS — M47812 Spondylosis without myelopathy or radiculopathy, cervical region: ICD-10-CM

## 2017-12-20 ENCOUNTER — Encounter: Admit: 2017-12-20 | Discharge: 2017-12-20 | Payer: BC Managed Care – PPO

## 2017-12-20 DIAGNOSIS — M67442 Ganglion, left hand: Principal | ICD-10-CM

## 2017-12-24 ENCOUNTER — Ambulatory Visit: Admit: 2017-12-24 | Discharge: 2017-12-24 | Payer: BC Managed Care – PPO

## 2017-12-25 ENCOUNTER — Encounter: Admit: 2017-12-25 | Discharge: 2017-12-25 | Payer: BC Managed Care – PPO

## 2017-12-25 ENCOUNTER — Ambulatory Visit: Admit: 2017-12-25 | Discharge: 2017-12-25 | Payer: BC Managed Care – PPO

## 2017-12-25 DIAGNOSIS — M67442 Ganglion, left hand: Principal | ICD-10-CM

## 2017-12-25 DIAGNOSIS — G4733 Obstructive sleep apnea (adult) (pediatric): ICD-10-CM

## 2017-12-25 DIAGNOSIS — D869 Sarcoidosis, unspecified: ICD-10-CM

## 2017-12-25 DIAGNOSIS — N200 Calculus of kidney: ICD-10-CM

## 2017-12-25 DIAGNOSIS — E119 Type 2 diabetes mellitus without complications: ICD-10-CM

## 2017-12-25 DIAGNOSIS — R931 Abnormal findings on diagnostic imaging of heart and coronary circulation: ICD-10-CM

## 2017-12-25 DIAGNOSIS — R112 Nausea with vomiting, unspecified: ICD-10-CM

## 2017-12-25 DIAGNOSIS — M720 Palmar fascial fibromatosis [Dupuytren]: ICD-10-CM

## 2017-12-25 DIAGNOSIS — H547 Unspecified visual loss: ICD-10-CM

## 2017-12-25 DIAGNOSIS — K760 Fatty (change of) liver, not elsewhere classified: ICD-10-CM

## 2017-12-25 DIAGNOSIS — J479 Bronchiectasis, uncomplicated: ICD-10-CM

## 2017-12-25 DIAGNOSIS — N3941 Urge incontinence: ICD-10-CM

## 2017-12-25 DIAGNOSIS — I5189 Other ill-defined heart diseases: ICD-10-CM

## 2017-12-25 DIAGNOSIS — M545 Low back pain: ICD-10-CM

## 2017-12-25 DIAGNOSIS — M25742 Osteophyte, left hand: ICD-10-CM

## 2017-12-25 DIAGNOSIS — M1990 Unspecified osteoarthritis, unspecified site: ICD-10-CM

## 2017-12-25 DIAGNOSIS — E785 Hyperlipidemia, unspecified: ICD-10-CM

## 2017-12-25 DIAGNOSIS — B029 Zoster without complications: ICD-10-CM

## 2017-12-25 DIAGNOSIS — E039 Hypothyroidism, unspecified: ICD-10-CM

## 2017-12-25 DIAGNOSIS — M19042 Primary osteoarthritis, left hand: ICD-10-CM

## 2017-12-25 DIAGNOSIS — M25561 Pain in right knee: ICD-10-CM

## 2017-12-25 DIAGNOSIS — M47812 Spondylosis without myelopathy or radiculopathy, cervical region: ICD-10-CM

## 2017-12-25 DIAGNOSIS — I10 Essential (primary) hypertension: ICD-10-CM

## 2017-12-25 DIAGNOSIS — J329 Chronic sinusitis, unspecified: ICD-10-CM

## 2017-12-25 DIAGNOSIS — M5020 Other cervical disc displacement, unspecified cervical region: ICD-10-CM

## 2017-12-25 DIAGNOSIS — E069 Thyroiditis, unspecified: ICD-10-CM

## 2017-12-25 DIAGNOSIS — C50919 Malignant neoplasm of unspecified site of unspecified female breast: ICD-10-CM

## 2017-12-25 LAB — POC GLUCOSE
Lab: 94 mg/dL (ref 70–100)
Lab: 80 mg/dL (ref 70–100)

## 2017-12-25 MED ORDER — LIDOCAINE (PF) 10 MG/ML (1 %) IJ SOLN
.1-2 mL | INTRAMUSCULAR | 0 refills | Status: DC | PRN
Start: 2017-12-25 — End: 2017-12-25

## 2017-12-25 MED ORDER — OXYCODONE 5 MG PO TAB
5 mg | ORAL_TABLET | ORAL | 0 refills | 6.00000 days | Status: AC | PRN
Start: 2017-12-25 — End: 2018-05-08

## 2017-12-25 MED ORDER — MIDAZOLAM 1 MG/ML IJ SOLN
INTRAVENOUS | 0 refills | Status: DC
Start: 2017-12-25 — End: 2017-12-25
  Administered 2017-12-25: 17:00:00 2 mg via INTRAVENOUS

## 2017-12-25 MED ORDER — LIDOCAINE-EPINEPHRINE 1 %-1:100,000 IJ SOLN
0 refills | Status: DC
Start: 2017-12-25 — End: 2017-12-25
  Administered 2017-12-25: 18:00:00 2 mL via INTRAMUSCULAR

## 2017-12-25 MED ORDER — BUPIVACAINE 0.25 % (2.5 MG/ML) IJ SOLN
0 refills | Status: DC
Start: 2017-12-25 — End: 2017-12-25
  Administered 2017-12-25: 18:00:00 2 mL via INTRAMUSCULAR

## 2017-12-25 MED ORDER — HALOPERIDOL LACTATE 5 MG/ML IJ SOLN
1 mg | Freq: Once | INTRAVENOUS | 0 refills | Status: DC | PRN
Start: 2017-12-25 — End: 2017-12-25

## 2017-12-25 MED ORDER — PROPOFOL INJ 10 MG/ML IV VIAL
0 refills | Status: DC
Start: 2017-12-25 — End: 2017-12-25
  Administered 2017-12-25: 18:00:00 10 mg via INTRAVENOUS

## 2017-12-25 MED ORDER — CEFAZOLIN 1 GRAM IJ SOLR
0 refills | Status: DC
Start: 2017-12-25 — End: 2017-12-25
  Administered 2017-12-25: 18:00:00 2 g via INTRAVENOUS

## 2017-12-25 MED ORDER — FENTANYL CITRATE (PF) 50 MCG/ML IJ SOLN
25 ug | INTRAVENOUS | 0 refills | Status: DC | PRN
Start: 2017-12-25 — End: 2017-12-25

## 2017-12-25 MED ORDER — SODIUM CHLORIDE 0.9 % IV SOLP
INTRAVENOUS | 0 refills | Status: DC
Start: 2017-12-25 — End: 2017-12-25

## 2017-12-25 MED ORDER — OXYCODONE 5 MG PO TAB
5 mg | ORAL_TABLET | ORAL | 0 refills | 6.00000 days | Status: AC | PRN
Start: 2017-12-25 — End: 2017-12-25

## 2017-12-25 MED ORDER — PROPOFOL 10 MG/ML IV EMUL 50 ML (INFUSION)(AM)(OR)
INTRAVENOUS | 0 refills | Status: DC
Start: 2017-12-25 — End: 2017-12-25
  Administered 2017-12-25: 17:00:00 50 ug/kg/min via INTRAVENOUS

## 2017-12-25 MED ORDER — PROMETHAZINE 25 MG/ML IJ SOLN
6.25 mg | INTRAVENOUS | 0 refills | Status: DC | PRN
Start: 2017-12-25 — End: 2017-12-25

## 2017-12-25 MED ORDER — FENTANYL CITRATE (PF) 50 MCG/ML IJ SOLN
50 ug | INTRAVENOUS | 0 refills | Status: DC | PRN
Start: 2017-12-25 — End: 2017-12-25

## 2017-12-25 MED ORDER — LIDOCAINE (PF) 200 MG/10 ML (2 %) IJ SYRG
0 refills | Status: DC
Start: 2017-12-25 — End: 2017-12-25
  Administered 2017-12-25: 17:00:00 50 mg via INTRAVENOUS

## 2017-12-25 MED ORDER — ONDANSETRON HCL (PF) 4 MG/2 ML IJ SOLN
4 mg | Freq: Once | INTRAVENOUS | 0 refills | Status: DC | PRN
Start: 2017-12-25 — End: 2017-12-25

## 2017-12-25 MED ADMIN — SODIUM CHLORIDE 0.9 % IV SOLP [27838]: INTRAVENOUS | @ 17:00:00 | Stop: 2017-12-25 | NDC 00338004904

## 2018-01-02 ENCOUNTER — Encounter: Admit: 2018-01-02 | Discharge: 2018-01-02 | Payer: BC Managed Care – PPO

## 2018-01-02 DIAGNOSIS — E069 Thyroiditis, unspecified: ICD-10-CM

## 2018-01-02 DIAGNOSIS — M19049 Primary osteoarthritis, unspecified hand: ICD-10-CM

## 2018-01-02 DIAGNOSIS — M720 Palmar fascial fibromatosis [Dupuytren]: Principal | ICD-10-CM

## 2018-01-02 DIAGNOSIS — N3941 Urge incontinence: ICD-10-CM

## 2018-01-02 DIAGNOSIS — H547 Unspecified visual loss: ICD-10-CM

## 2018-01-02 DIAGNOSIS — E119 Type 2 diabetes mellitus without complications: ICD-10-CM

## 2018-01-02 DIAGNOSIS — G4733 Obstructive sleep apnea (adult) (pediatric): ICD-10-CM

## 2018-01-02 DIAGNOSIS — M545 Low back pain: ICD-10-CM

## 2018-01-02 DIAGNOSIS — R931 Abnormal findings on diagnostic imaging of heart and coronary circulation: ICD-10-CM

## 2018-01-02 DIAGNOSIS — E785 Hyperlipidemia, unspecified: ICD-10-CM

## 2018-01-02 DIAGNOSIS — M1990 Unspecified osteoarthritis, unspecified site: ICD-10-CM

## 2018-01-02 DIAGNOSIS — R112 Nausea with vomiting, unspecified: ICD-10-CM

## 2018-01-02 DIAGNOSIS — M5020 Other cervical disc displacement, unspecified cervical region: ICD-10-CM

## 2018-01-02 DIAGNOSIS — J329 Chronic sinusitis, unspecified: ICD-10-CM

## 2018-01-02 DIAGNOSIS — E039 Hypothyroidism, unspecified: Secondary | ICD-10-CM

## 2018-01-02 DIAGNOSIS — I5189 Other ill-defined heart diseases: ICD-10-CM

## 2018-01-02 DIAGNOSIS — D869 Sarcoidosis, unspecified: ICD-10-CM

## 2018-01-02 DIAGNOSIS — M47812 Spondylosis without myelopathy or radiculopathy, cervical region: ICD-10-CM

## 2018-01-02 DIAGNOSIS — C50919 Malignant neoplasm of unspecified site of unspecified female breast: ICD-10-CM

## 2018-01-02 DIAGNOSIS — J479 Bronchiectasis, uncomplicated: ICD-10-CM

## 2018-01-02 DIAGNOSIS — B029 Zoster without complications: ICD-10-CM

## 2018-01-02 DIAGNOSIS — M67442 Ganglion, left hand: ICD-10-CM

## 2018-01-02 DIAGNOSIS — N200 Calculus of kidney: ICD-10-CM

## 2018-01-02 DIAGNOSIS — K760 Fatty (change of) liver, not elsewhere classified: Secondary | ICD-10-CM

## 2018-01-02 DIAGNOSIS — M25561 Pain in right knee: ICD-10-CM

## 2018-01-02 DIAGNOSIS — I10 Essential (primary) hypertension: ICD-10-CM

## 2018-01-03 ENCOUNTER — Ambulatory Visit: Admit: 2018-01-02 | Discharge: 2018-01-03 | Payer: BC Managed Care – PPO

## 2018-01-03 DIAGNOSIS — M720 Palmar fascial fibromatosis [Dupuytren]: Principal | ICD-10-CM

## 2018-01-07 ENCOUNTER — Encounter: Admit: 2018-01-07 | Discharge: 2018-01-07 | Payer: BC Managed Care – PPO

## 2018-01-09 ENCOUNTER — Ambulatory Visit: Admit: 2018-01-09 | Discharge: 2018-01-10 | Payer: BC Managed Care – PPO

## 2018-01-29 ENCOUNTER — Ambulatory Visit: Admit: 2018-01-29 | Discharge: 2018-01-29 | Payer: BC Managed Care – PPO

## 2018-01-29 ENCOUNTER — Encounter: Admit: 2018-01-29 | Discharge: 2018-01-29 | Payer: BC Managed Care – PPO

## 2018-01-29 DIAGNOSIS — Z79811 Long term (current) use of aromatase inhibitors: Principal | ICD-10-CM

## 2018-01-29 DIAGNOSIS — C50412 Malignant neoplasm of upper-outer quadrant of left female breast: ICD-10-CM

## 2018-01-29 DIAGNOSIS — Z13820 Encounter for screening for osteoporosis: ICD-10-CM

## 2018-01-29 DIAGNOSIS — G459 Transient cerebral ischemic attack, unspecified: Principal | ICD-10-CM

## 2018-03-06 ENCOUNTER — Encounter: Admit: 2018-03-06 | Discharge: 2018-03-06 | Payer: BC Managed Care – PPO

## 2018-03-06 DIAGNOSIS — C50412 Malignant neoplasm of upper-outer quadrant of left female breast: Principal | ICD-10-CM

## 2018-03-06 DIAGNOSIS — E119 Type 2 diabetes mellitus without complications: ICD-10-CM

## 2018-03-06 DIAGNOSIS — M67442 Ganglion, left hand: ICD-10-CM

## 2018-03-06 DIAGNOSIS — R931 Abnormal findings on diagnostic imaging of heart and coronary circulation: ICD-10-CM

## 2018-03-06 DIAGNOSIS — K760 Fatty (change of) liver, not elsewhere classified: Secondary | ICD-10-CM

## 2018-03-06 DIAGNOSIS — M19049 Primary osteoarthritis, unspecified hand: ICD-10-CM

## 2018-03-06 DIAGNOSIS — I5189 Other ill-defined heart diseases: ICD-10-CM

## 2018-03-06 DIAGNOSIS — N3941 Urge incontinence: ICD-10-CM

## 2018-03-06 DIAGNOSIS — G4733 Obstructive sleep apnea (adult) (pediatric): ICD-10-CM

## 2018-03-06 DIAGNOSIS — M47812 Spondylosis without myelopathy or radiculopathy, cervical region: ICD-10-CM

## 2018-03-06 DIAGNOSIS — C50919 Malignant neoplasm of unspecified site of unspecified female breast: ICD-10-CM

## 2018-03-06 DIAGNOSIS — M1990 Unspecified osteoarthritis, unspecified site: ICD-10-CM

## 2018-03-06 DIAGNOSIS — D869 Sarcoidosis, unspecified: ICD-10-CM

## 2018-03-06 DIAGNOSIS — M545 Low back pain: ICD-10-CM

## 2018-03-06 DIAGNOSIS — R112 Nausea with vomiting, unspecified: ICD-10-CM

## 2018-03-06 DIAGNOSIS — M720 Palmar fascial fibromatosis [Dupuytren]: Principal | ICD-10-CM

## 2018-03-06 DIAGNOSIS — H547 Unspecified visual loss: ICD-10-CM

## 2018-03-06 DIAGNOSIS — M25561 Pain in right knee: ICD-10-CM

## 2018-03-06 DIAGNOSIS — B029 Zoster without complications: ICD-10-CM

## 2018-03-06 DIAGNOSIS — Z170 Estrogen receptor positive status [ER+]: ICD-10-CM

## 2018-03-06 DIAGNOSIS — I10 Essential (primary) hypertension: ICD-10-CM

## 2018-03-06 DIAGNOSIS — E039 Hypothyroidism, unspecified: ICD-10-CM

## 2018-03-06 DIAGNOSIS — E785 Hyperlipidemia, unspecified: ICD-10-CM

## 2018-03-06 DIAGNOSIS — J479 Bronchiectasis, uncomplicated: ICD-10-CM

## 2018-03-06 DIAGNOSIS — E069 Thyroiditis, unspecified: ICD-10-CM

## 2018-03-06 DIAGNOSIS — J329 Chronic sinusitis, unspecified: ICD-10-CM

## 2018-03-06 DIAGNOSIS — M5020 Other cervical disc displacement, unspecified cervical region: ICD-10-CM

## 2018-03-06 DIAGNOSIS — N200 Calculus of kidney: ICD-10-CM

## 2018-05-08 ENCOUNTER — Encounter: Admit: 2018-05-08 | Discharge: 2018-05-08 | Payer: BC Managed Care – PPO

## 2018-05-08 ENCOUNTER — Ambulatory Visit: Admit: 2018-05-08 | Discharge: 2018-05-09 | Payer: BC Managed Care – PPO

## 2018-05-08 DIAGNOSIS — E119 Type 2 diabetes mellitus without complications: ICD-10-CM

## 2018-05-08 DIAGNOSIS — M65842 Other synovitis and tenosynovitis, left hand: ICD-10-CM

## 2018-05-08 DIAGNOSIS — R112 Nausea with vomiting, unspecified: ICD-10-CM

## 2018-05-08 DIAGNOSIS — M19049 Primary osteoarthritis, unspecified hand: ICD-10-CM

## 2018-05-08 DIAGNOSIS — G4733 Obstructive sleep apnea (adult) (pediatric): ICD-10-CM

## 2018-05-08 DIAGNOSIS — E039 Hypothyroidism, unspecified: ICD-10-CM

## 2018-05-08 DIAGNOSIS — M1990 Unspecified osteoarthritis, unspecified site: ICD-10-CM

## 2018-05-08 DIAGNOSIS — J329 Chronic sinusitis, unspecified: ICD-10-CM

## 2018-05-08 DIAGNOSIS — I10 Essential (primary) hypertension: ICD-10-CM

## 2018-05-08 DIAGNOSIS — M5020 Other cervical disc displacement, unspecified cervical region: ICD-10-CM

## 2018-05-08 DIAGNOSIS — B029 Zoster without complications: ICD-10-CM

## 2018-05-08 DIAGNOSIS — N3941 Urge incontinence: ICD-10-CM

## 2018-05-08 DIAGNOSIS — M720 Palmar fascial fibromatosis [Dupuytren]: Principal | ICD-10-CM

## 2018-05-08 DIAGNOSIS — D869 Sarcoidosis, unspecified: ICD-10-CM

## 2018-05-08 DIAGNOSIS — C50919 Malignant neoplasm of unspecified site of unspecified female breast: ICD-10-CM

## 2018-05-08 DIAGNOSIS — M47812 Spondylosis without myelopathy or radiculopathy, cervical region: ICD-10-CM

## 2018-05-08 DIAGNOSIS — M25561 Pain in right knee: ICD-10-CM

## 2018-05-08 DIAGNOSIS — H547 Unspecified visual loss: ICD-10-CM

## 2018-05-08 DIAGNOSIS — J479 Bronchiectasis, uncomplicated: ICD-10-CM

## 2018-05-08 DIAGNOSIS — K760 Fatty (change of) liver, not elsewhere classified: ICD-10-CM

## 2018-05-08 DIAGNOSIS — I5189 Other ill-defined heart diseases: ICD-10-CM

## 2018-05-08 DIAGNOSIS — M545 Low back pain: ICD-10-CM

## 2018-05-08 DIAGNOSIS — M67442 Ganglion, left hand: ICD-10-CM

## 2018-05-08 DIAGNOSIS — R931 Abnormal findings on diagnostic imaging of heart and coronary circulation: ICD-10-CM

## 2018-05-08 DIAGNOSIS — E785 Hyperlipidemia, unspecified: ICD-10-CM

## 2018-05-08 DIAGNOSIS — N200 Calculus of kidney: ICD-10-CM

## 2018-05-08 DIAGNOSIS — E069 Thyroiditis, unspecified: ICD-10-CM

## 2018-05-08 MED ORDER — TRIAMCINOLONE ACETONIDE 40 MG/ML IJ SUSP
40 mg | Freq: Once | INTRAMUSCULAR | 0 refills | Status: CP
Start: 2018-05-08 — End: ?
  Administered 2018-05-08: 18:00:00 5 mg via INTRAMUSCULAR

## 2018-05-08 MED ORDER — LIDOCAINE HCL 10 MG/ML (1 %) IJ SOLN
1-20 mL | Freq: Once | 0 refills | Status: CP
Start: 2018-05-08 — End: ?
  Administered 2018-05-08: 18:00:00 0.25 mL

## 2018-05-09 DIAGNOSIS — M65842 Other synovitis and tenosynovitis, left hand: Principal | ICD-10-CM

## 2018-08-06 ENCOUNTER — Encounter: Admit: 2018-08-06 | Discharge: 2018-08-06 | Payer: BC Managed Care – PPO

## 2018-08-06 DIAGNOSIS — I10 Essential (primary) hypertension: Secondary | ICD-10-CM

## 2018-08-06 DIAGNOSIS — M65842 Other synovitis and tenosynovitis, left hand: Secondary | ICD-10-CM

## 2018-08-06 DIAGNOSIS — E785 Hyperlipidemia, unspecified: Secondary | ICD-10-CM

## 2018-08-06 DIAGNOSIS — K760 Fatty (change of) liver, not elsewhere classified: Secondary | ICD-10-CM

## 2018-08-06 DIAGNOSIS — M47812 Spondylosis without myelopathy or radiculopathy, cervical region: Secondary | ICD-10-CM

## 2018-08-06 DIAGNOSIS — M19049 Primary osteoarthritis, unspecified hand: Secondary | ICD-10-CM

## 2018-08-06 DIAGNOSIS — R112 Nausea with vomiting, unspecified: Secondary | ICD-10-CM

## 2018-08-06 DIAGNOSIS — M1990 Unspecified osteoarthritis, unspecified site: Secondary | ICD-10-CM

## 2018-08-06 DIAGNOSIS — M5020 Other cervical disc displacement, unspecified cervical region: Secondary | ICD-10-CM

## 2018-08-06 DIAGNOSIS — C50919 Malignant neoplasm of unspecified site of unspecified female breast: Secondary | ICD-10-CM

## 2018-08-06 DIAGNOSIS — I5189 Other ill-defined heart diseases: Secondary | ICD-10-CM

## 2018-08-06 DIAGNOSIS — J479 Bronchiectasis, uncomplicated: Secondary | ICD-10-CM

## 2018-08-06 DIAGNOSIS — E039 Hypothyroidism, unspecified: Secondary | ICD-10-CM

## 2018-08-06 DIAGNOSIS — C50412 Malignant neoplasm of upper-outer quadrant of left female breast: Secondary | ICD-10-CM

## 2018-08-06 DIAGNOSIS — J329 Chronic sinusitis, unspecified: Secondary | ICD-10-CM

## 2018-08-06 DIAGNOSIS — R931 Abnormal findings on diagnostic imaging of heart and coronary circulation: Secondary | ICD-10-CM

## 2018-08-06 DIAGNOSIS — G4733 Obstructive sleep apnea (adult) (pediatric): Secondary | ICD-10-CM

## 2018-08-06 DIAGNOSIS — N3941 Urge incontinence: Secondary | ICD-10-CM

## 2018-08-06 DIAGNOSIS — B029 Zoster without complications: Secondary | ICD-10-CM

## 2018-08-06 DIAGNOSIS — N200 Calculus of kidney: Secondary | ICD-10-CM

## 2018-08-06 DIAGNOSIS — M67442 Ganglion, left hand: Secondary | ICD-10-CM

## 2018-08-06 DIAGNOSIS — M25561 Pain in right knee: Secondary | ICD-10-CM

## 2018-08-06 DIAGNOSIS — E119 Type 2 diabetes mellitus without complications: Secondary | ICD-10-CM

## 2018-08-06 DIAGNOSIS — M720 Palmar fascial fibromatosis [Dupuytren]: Secondary | ICD-10-CM

## 2018-08-06 DIAGNOSIS — H547 Unspecified visual loss: Secondary | ICD-10-CM

## 2018-08-06 DIAGNOSIS — M545 Low back pain: Secondary | ICD-10-CM

## 2018-08-06 DIAGNOSIS — E069 Thyroiditis, unspecified: Secondary | ICD-10-CM

## 2018-08-06 DIAGNOSIS — D869 Sarcoidosis, unspecified: Secondary | ICD-10-CM

## 2018-08-06 MED ORDER — ANASTROZOLE 1 MG PO TAB
1 mg | ORAL_TABLET | Freq: Every day | ORAL | 3 refills | 33.00000 days | Status: AC
Start: 2018-08-06 — End: 2019-04-22

## 2018-09-10 ENCOUNTER — Encounter: Admit: 2018-09-10 | Discharge: 2018-09-10 | Payer: BC Managed Care – PPO

## 2018-09-25 ENCOUNTER — Ambulatory Visit: Admit: 2018-09-25 | Discharge: 2018-09-25 | Payer: BC Managed Care – PPO

## 2018-09-25 ENCOUNTER — Encounter: Admit: 2018-09-25 | Discharge: 2018-09-25 | Payer: BC Managed Care – PPO

## 2018-09-25 DIAGNOSIS — M720 Palmar fascial fibromatosis [Dupuytren]: Principal | ICD-10-CM

## 2018-10-03 ENCOUNTER — Encounter: Admit: 2018-10-03 | Discharge: 2018-10-03 | Payer: BC Managed Care – PPO

## 2018-10-22 ENCOUNTER — Encounter: Admit: 2018-10-22 | Discharge: 2018-10-22 | Payer: BC Managed Care – PPO

## 2018-11-19 ENCOUNTER — Encounter: Admit: 2018-11-19 | Discharge: 2018-11-19 | Payer: BC Managed Care – PPO

## 2018-12-02 IMAGING — CT Head^_WITHOUT_CONTRAST (Adult)
1 series · 16 of 30 positions shown, 20 images · non-contrast
Comparison: none

[Series 2: brain w/o 4.8 brain · axial · non-contrast · 0.55mm/px · z∈[+141,+282]mm · 16 of 30 slices shown, 20 images]
[im 2/30  brain]
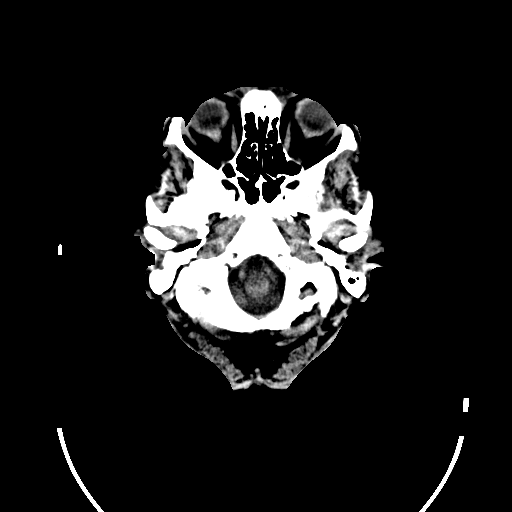
[im 2/30  bone]
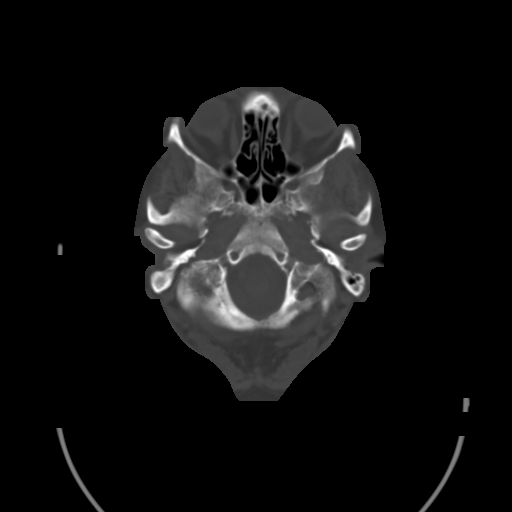
[im 4/30  brain]
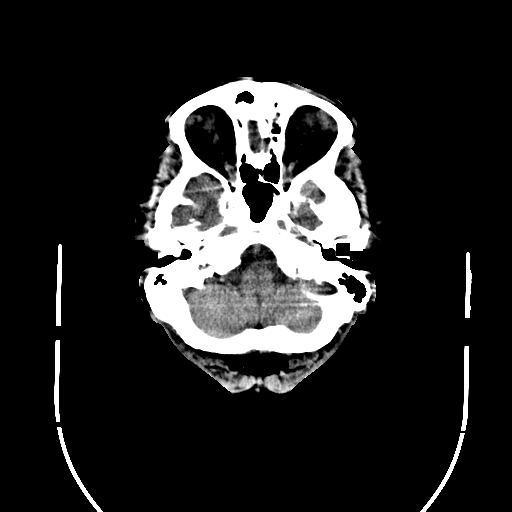
[im 6/30  brain]
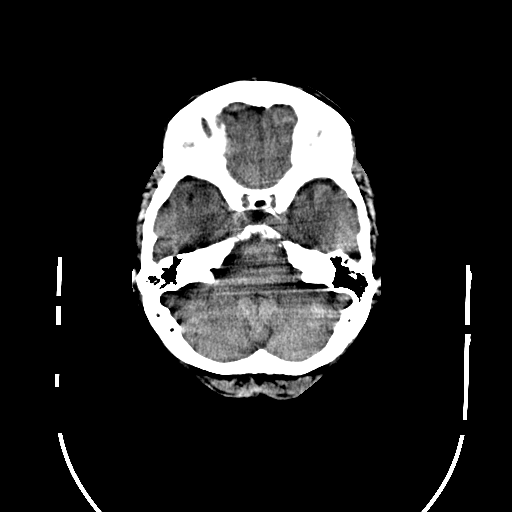
[im 8/30  brain]
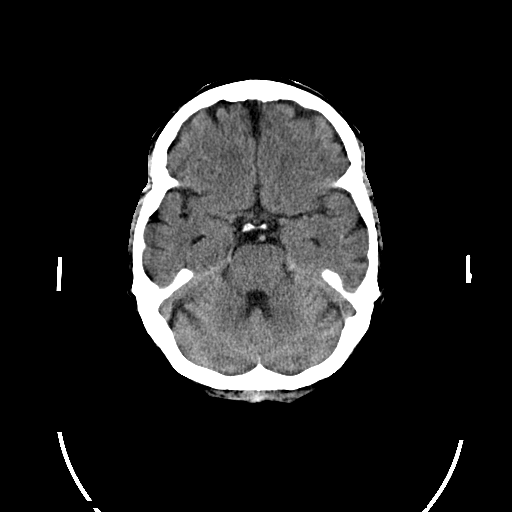
[im 9/30  brain]
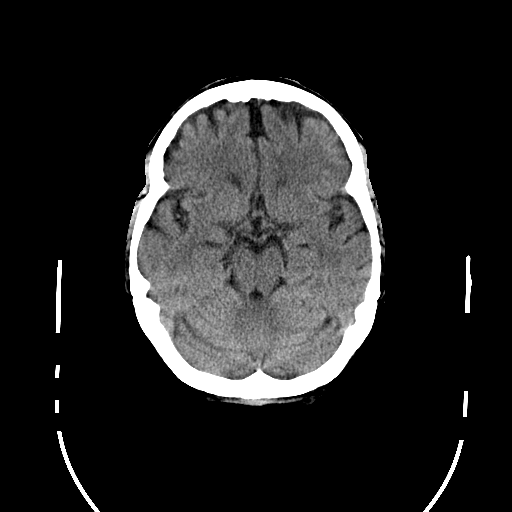
[im 9/30  bone]
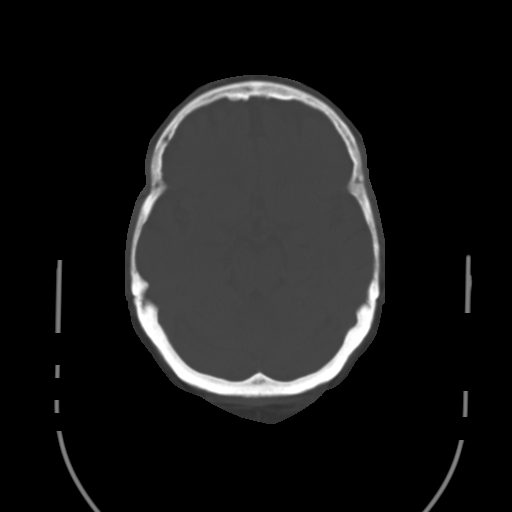
[im 11/30  brain]
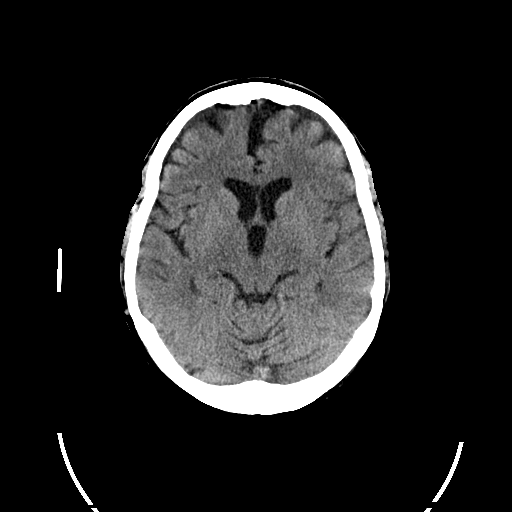
[im 13/30  brain]
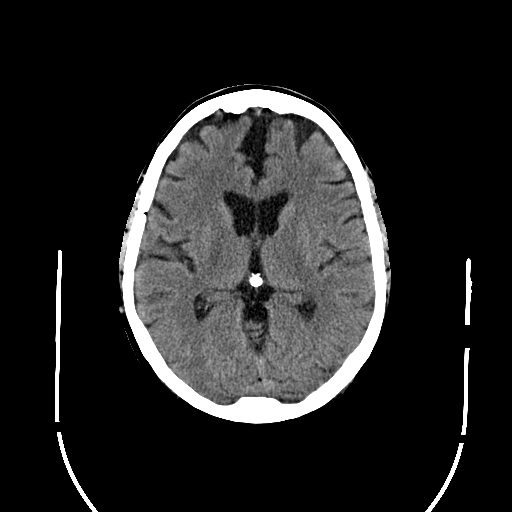
[im 15/30  brain]
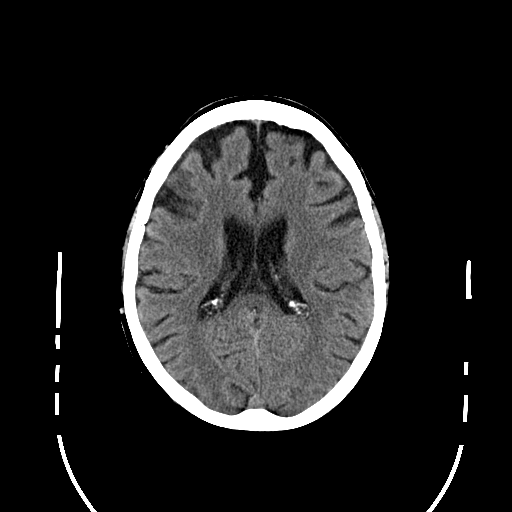
[im 16/30  brain]
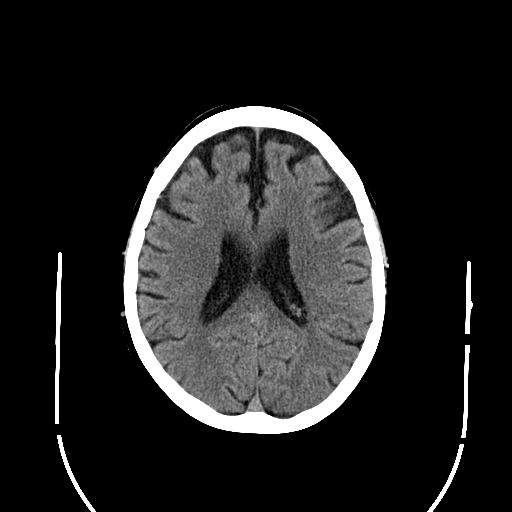
[im 16/30  bone]
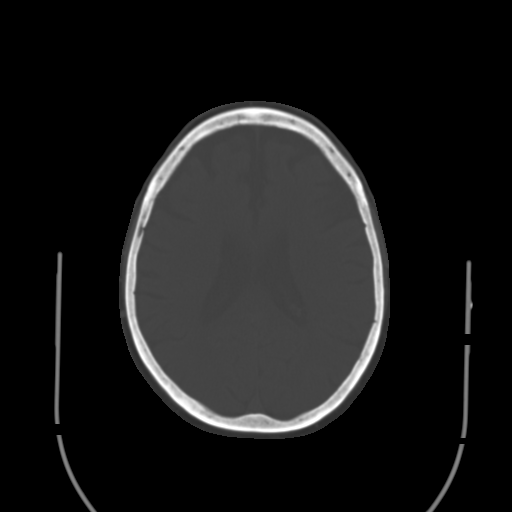
[im 18/30  brain]
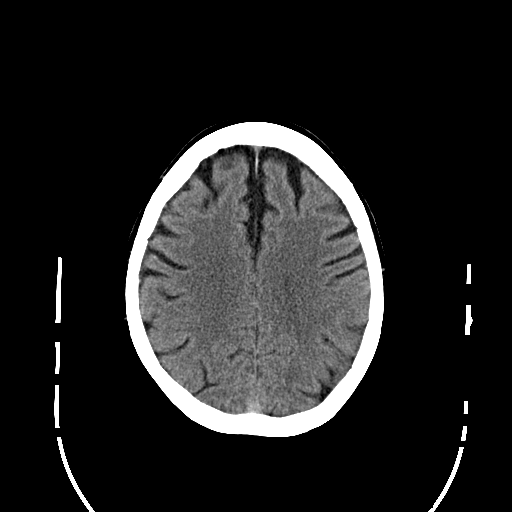
[im 20/30  brain]
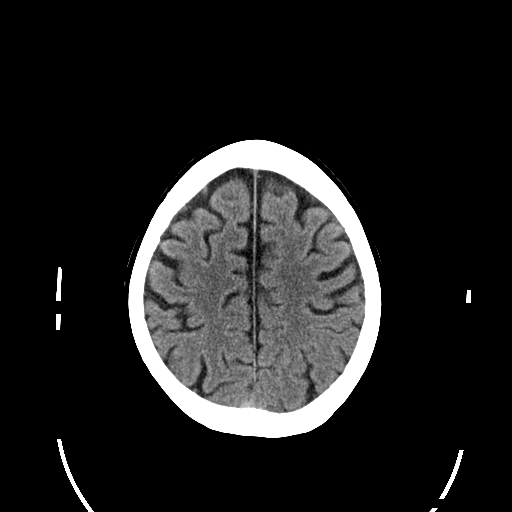
[im 22/30  brain]
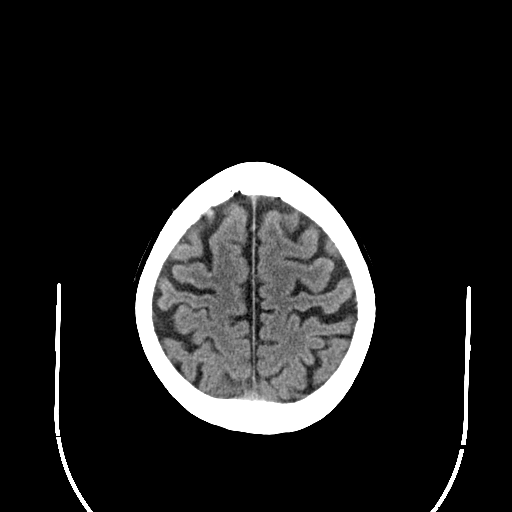
[im 23/30  brain]
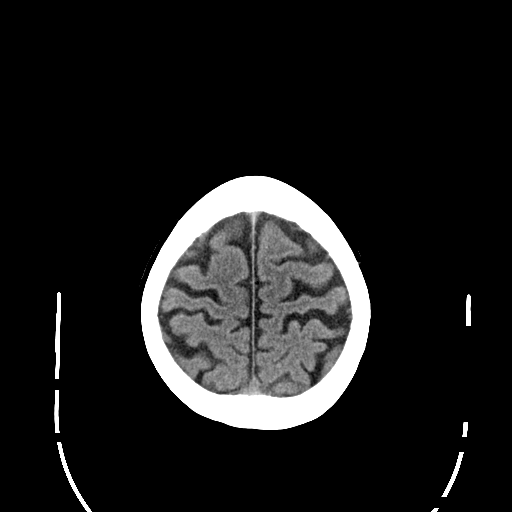
[im 23/30  bone]
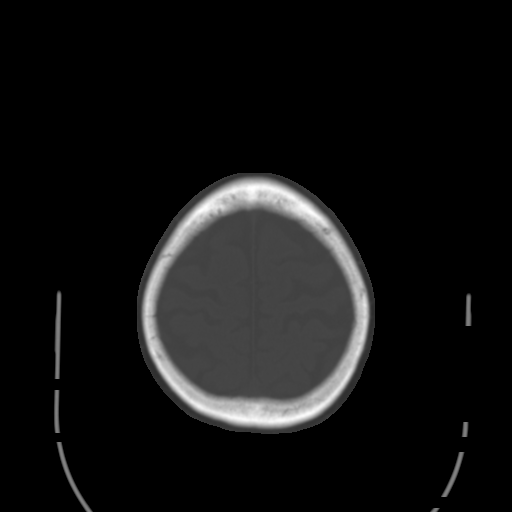
[im 25/30  brain]
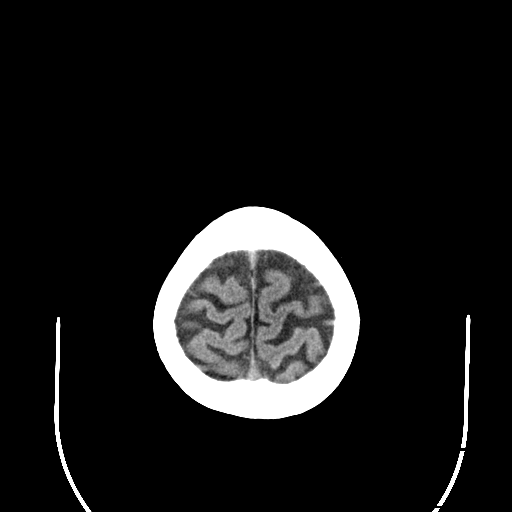
[im 27/30  brain]
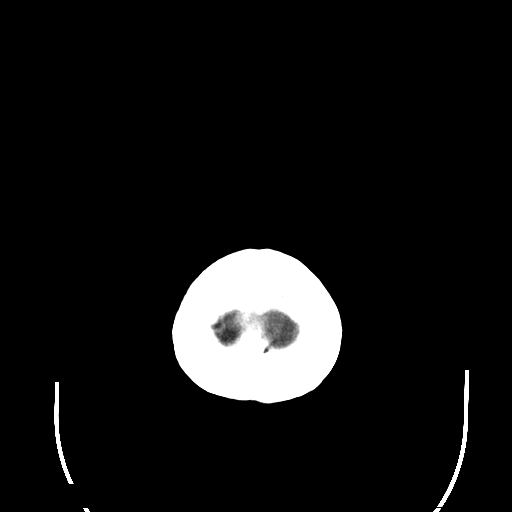
[im 29/30  brain]
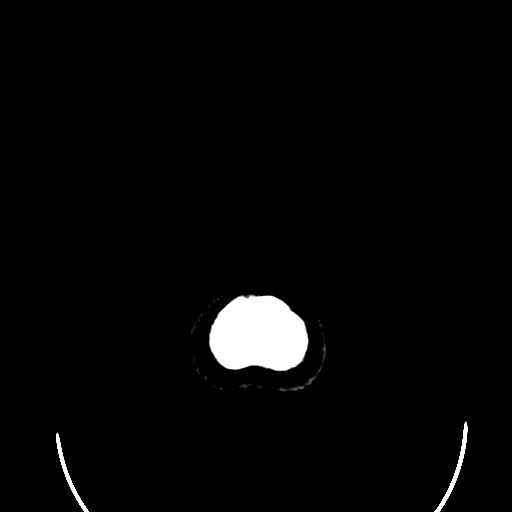

[16 of 30 positions shown; findings below may reference images not displayed]

DIAGNOSTIC STUDIES
All CT scans at this facility use dose modulation, iterative reconstruction, and/or weight based
dosing when appropriate to reduce radiation dose to as low as reasonably achievable.

EXAM

Unenhanced CT brain

INDICATION
Left leg weakness and speech difficulty

TECHNIQUE

Unenhanced CT brain

COMPARISONS

None

FINDINGS

No depressed skull fracture. Mild ethmoid sinus disease.
Mild atrophy and small vessel disease. No hemorrhage or acute cortical infarction. Posterior fossa
and temporal lobe intact.

IMPRESSION
Mild atrophy, small vessel disease. No hemorrhage or acute cortical infarction. MRI can further
evaluate

Tech Notes:

PT FELL. LEFT LEG WEAKNESS AND SPEECH DIFFICULTIES. LM

## 2018-12-25 ENCOUNTER — Encounter: Admit: 2018-12-25 | Discharge: 2018-12-25

## 2018-12-25 ENCOUNTER — Encounter: Admit: 2018-12-25 | Discharge: 2018-12-26

## 2018-12-25 DIAGNOSIS — I5189 Other ill-defined heart diseases: Secondary | ICD-10-CM

## 2018-12-25 DIAGNOSIS — I10 Essential (primary) hypertension: Secondary | ICD-10-CM

## 2018-12-25 DIAGNOSIS — H547 Unspecified visual loss: Secondary | ICD-10-CM

## 2018-12-25 DIAGNOSIS — M65842 Other synovitis and tenosynovitis, left hand: Secondary | ICD-10-CM

## 2018-12-25 DIAGNOSIS — E119 Type 2 diabetes mellitus without complications: Secondary | ICD-10-CM

## 2018-12-25 DIAGNOSIS — E785 Hyperlipidemia, unspecified: Secondary | ICD-10-CM

## 2018-12-25 DIAGNOSIS — M47812 Spondylosis without myelopathy or radiculopathy, cervical region: Secondary | ICD-10-CM

## 2018-12-25 DIAGNOSIS — Z1239 Encounter for other screening for malignant neoplasm of breast: Secondary | ICD-10-CM

## 2018-12-25 DIAGNOSIS — N3941 Urge incontinence: Secondary | ICD-10-CM

## 2018-12-25 DIAGNOSIS — G4733 Obstructive sleep apnea (adult) (pediatric): Secondary | ICD-10-CM

## 2018-12-25 DIAGNOSIS — Z08 Encounter for follow-up examination after completed treatment for malignant neoplasm: Secondary | ICD-10-CM

## 2018-12-25 DIAGNOSIS — M545 Low back pain: Secondary | ICD-10-CM

## 2018-12-25 DIAGNOSIS — R931 Abnormal findings on diagnostic imaging of heart and coronary circulation: Secondary | ICD-10-CM

## 2018-12-25 DIAGNOSIS — C50412 Malignant neoplasm of upper-outer quadrant of left female breast: Secondary | ICD-10-CM

## 2018-12-25 DIAGNOSIS — J329 Chronic sinusitis, unspecified: Secondary | ICD-10-CM

## 2018-12-25 DIAGNOSIS — R112 Nausea with vomiting, unspecified: Secondary | ICD-10-CM

## 2018-12-25 DIAGNOSIS — J479 Bronchiectasis, uncomplicated: Secondary | ICD-10-CM

## 2018-12-25 DIAGNOSIS — M19049 Primary osteoarthritis, unspecified hand: Secondary | ICD-10-CM

## 2018-12-25 DIAGNOSIS — E069 Thyroiditis, unspecified: Secondary | ICD-10-CM

## 2018-12-25 DIAGNOSIS — C50919 Malignant neoplasm of unspecified site of unspecified female breast: Secondary | ICD-10-CM

## 2018-12-25 DIAGNOSIS — M67442 Ganglion, left hand: Secondary | ICD-10-CM

## 2018-12-25 DIAGNOSIS — B029 Zoster without complications: Secondary | ICD-10-CM

## 2018-12-25 DIAGNOSIS — E039 Hypothyroidism, unspecified: Secondary | ICD-10-CM

## 2018-12-25 DIAGNOSIS — M5020 Other cervical disc displacement, unspecified cervical region: Secondary | ICD-10-CM

## 2018-12-25 DIAGNOSIS — Z170 Estrogen receptor positive status [ER+]: Secondary | ICD-10-CM

## 2018-12-25 DIAGNOSIS — K760 Fatty (change of) liver, not elsewhere classified: Secondary | ICD-10-CM

## 2018-12-25 DIAGNOSIS — M25561 Pain in right knee: Secondary | ICD-10-CM

## 2018-12-25 DIAGNOSIS — M720 Palmar fascial fibromatosis [Dupuytren]: Secondary | ICD-10-CM

## 2018-12-25 DIAGNOSIS — M1990 Unspecified osteoarthritis, unspecified site: Secondary | ICD-10-CM

## 2018-12-25 DIAGNOSIS — N200 Calculus of kidney: Secondary | ICD-10-CM

## 2018-12-25 DIAGNOSIS — D869 Sarcoidosis, unspecified: Secondary | ICD-10-CM

## 2018-12-25 NOTE — Progress Notes
Name: Babli Derring          MRN: [id number]      DOB: [birth year]      AGE: 73 y.o.   DATE OF SERVICE: 12/25/2018    Subjective:             Reason for Visit:  Heme/Onc Care      Brileigh Kramarz is a 73 y.o. female.     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 Theilen returns to the clinic today for routine follow up.  She is 15 months out from diagnosis.  She is overall doing well.  She recently self palpated an area in the right UOQ, and this was imaged with ultrasound at the time of her bilateral diagnostic mammogram earlier today.  There were no worrisome findings.  She states she recently had shingles on her big toe that was treated successfully.  She has also made and donated over 300 cloth masks to various facilities in her home town.      HISTORY:  Ms. Gironda is a Caucaisan female who presented to the [facility] on 10/16/2017 at age 69 for surgical evaluation of her newly diagnosed breast cancer.  Ms. Hermans had no breast concerns such as palpable masses, skin changes, or nipple discharge prior to screening mammogram which revealed an asymmetric density in the left breast at 2:00.  A concerning mass was identified in this location on left diagnostic mammogram and left breast ultrasound, and biopsy was recommended.  Ultrasound guided left breast biopsy on 10/08/17 [facility]) revealed grade 2 IDC with DCIS.  Ms. Thrun reports a history of a left breast excisional biopsy in 1989 at [facility] that was benign. She denies any other history of breast issues, surgeries or biopsies.   Ms. Philipps underwent left RSL lumpectomy on 10/24/17, and final pathology revealed 1.6 cm grade 2 IDC; DCIS present; LVI not definitely identified; margins clear.  She declined radiation therapy after meeting with Dr. Clovis Riley.  She started Arimidex in 10/2017.    BREAST IMAGING:  Mammogram:    -- Bilateral screening mammogram 09/21/17 [facility]) revealed benign calcifications present without suspicious calcifications.  There was a developing asymmetric density within the upper outer left breast 10-11 cm FTN near the 2;00 position.  No other suspicious mass, asymmetric density, or architectural distortion.  -- Left diagnostic mammogram 09/21/17 [facility]) revealed a spiculated mass in the upper outer left breast corresponding to the area of mammographic concern.  Ultrasound:    -- Left breast ultrasound 10/05/17 [facility]) revealed in the area of concern, there was an irregularly marginated mass measuring 0.6 x 0.9 x 1.1 cm which was taller than wide in the 2:00 position, 11 cm FTN.  The remaining breast was unremarkable.  Biopsy was recommended.  BIRADS 5.  -- Targeted left breast ultrasound 10/16/17 ([place]) revealed an irregular hypoechoic mass with echogenic focus consistent with biopsy marker in the 2:00 position of the  breast, 11 cm from the nipple. This measured 0.8 cm radial by 1.1 cm antiradial by 1.0 cm. It had a maximum transverse dimension of 1.2 cm. No morphologically abnormal lymph nodes were identified in the left axilla. 5 benign-appearing lymph nodes were seen. Impression: 1.2 cm irregular mass in the 2:00 position left breast consistent biopsy-proven malignancy.    REPRODUCTIVE HEALTH:  Age at first Menarche:  59  Age at First Live Birth:  44  Age at Menopause: 28.  Used HRT briefly   Gravida:  1  Para: 1  Breastfeeding:  No    PROCEDURE: 1.  Left breast excisional biopsy, 1989 [facility])  2.  Left RSL lumpectomy, 10/24/17 [facility])  PERTINENT PMH:  Sarcoidosis, OSA (uses CPAP), HTN, DM2  FAMILY HISTORY:  Mother-Breast cancer diagnosed at age 75. Maternal Grandmother-Breast cancer  PHYSICAL EXAM on PRESENTATION:  Biopsy skin changes noted in the left UOQ.  Well healed previous excisional biopsy scar on the left breast. No palpable breast masses. No skin, nipple, or areolar change. No supraclavicular, infraclavicular, or axillary adenopathy.   MEDICAL ONCOLOGY: Dr. Welton Flakes  PRESENT THERAPY:  Arimidex started in 10/2017  REFERRED BY:  Self referral       Review of Systems    Constitutional: Negative for fever, chills, appetite change and fatigue.   HENT: Negative for hearing loss, congestion, rhinorrhea and tinnitus.    Eyes: Negative for pain, discharge and itching.   Respiratory: Negative for cough, chest tightness and shortness of breath.    Cardiovascular: Negative for chest pain and palpitations.   Gastrointestinal: Negative for abdominal distention, pain, nausea, vomiting, and diarrhea.   Genitourinary: Negative for frequency, vaginal bleeding, difficulty urinating and pelvic pain.   Musculoskeletal: Negative for myalgias, back pain, joint swelling and arthralgias.   Skin: Negative for rash.   Neurological: Negative for dizziness, weakness, light-headedness and headaches.   Hematological: Does not bruise/bleed easily.   Psychiatric/Behavioral: Negative for disturbed wake/sleep cycle. The patient is not nervous/anxious.    The following medical/surgical/family/social history and the list of medications are current, as of 12/25/2018    Medical History:   Diagnosis Date   ??? Acquired hypothyroidism    ??? Breast cancer (HCC) 2019    left breast   ??? Bronchiectasis (HCC) 2016   ??? Bulge of cervical disc without myelopathy     C6-7   ??? Chronic sinusitis    ??? Diabetes mellitus (HCC)     type 2   ??? Diastolic dysfunction     per pt ??? Digital mucous cyst of finger of left hand 12/19/2017   ??? Dupuytren's disease of palm 06/12/2016   ??? Echocardiogram abnormal 03/20/2013    left side mild diastolic failure dysfunction not relaxing   ??? Fatty liver disease, nonalcoholic    ??? Hand arthritis 01/02/2018   ??? Hyperlipidemia    ??? Hypertension    ??? Hypothyroidism 1993   ??? Kidney stone 08/08/2014    2mm   ??? Lower back pain    ??? OSA on CPAP 1999    w/CPAP   ??? Osteoarthritis     left hand   ??? PONV (postoperative nausea and vomiting)    ??? Right knee pain     injections x3   ??? Sarcoidosis 1995   ??? Shingles 11/06/2016    back of neck   ??? Spondylosis of cervical spine     C5-6   ??? Steatosis of liver 06/2015   ??? Stenosing tenosynovitis of finger of  left hand 05/08/2018   ??? Thyroiditis    ??? Urge incontinence 01/15/2016   ??? Vision decreased      Surgical History:   Procedure Laterality Date   ??? TONSILLECTOMY  1953   ??? HX DILATION AND CURETTAGE  1992   ??? STERNUM SURGERY  09/26/1991    removal of thyroid mass size of hard ball.   ??? HX CARPAL TUNNEL RELEASE Bilateral 1997   ??? KNEE REPLACEMENT Left 02/2013   ??? TRANSTHORACIC ECHOCARDIOGRAM  03/20/2013   ??? COLONOSCOPY  2015   ??? HAND FUSION Right 2017    Thumb   ??? HX HAND SURGERY Right 2017    [facility] Right hand broke bone spur   ??? HX HAND SURGERY Left 2017    [facility] bone spur   ??? TOE SURGERY Bilateral 2017    great toes shortened; toes straightened   ??? SHOULDER ARTHROSCOPY Right 04/27/2016    removed long pointed bone spur   ??? HX ROTATOR CUFF REPAIR Left 06/29/2016   ??? TOE SURGERY Right 11/17/2016    right foot and plantar mass reduction   ??? LEFT RADIOACTIVE SEED LOCALIZED LUMPECTOMY Left 10/24/2017    Performed by Cordelia Poche, MD at [facility] OR   ??? EXCISION MUCOUS CYST LEFT INDEX FINGER Left 12/25/2017    Performed by Sharyn Blitz, MD at [facility] OR   ??? BLADDER SUSPENSION     ??? BREAST LUMPECTOMY Left    ??? FALLOPIAN TUBE OCCLUSION  ~1978     Family History   Problem Relation Age of Onset   ??? Cancer-Breast Mother age 50 and contralateral breast at age 74   ??? Cancer-Colon Mother    ??? Cancer Mother    ??? Thyroid Disease Mother    ??? Heart Disease Father    ??? Hypertension Sister    ??? Thyroid Disease Sister    ??? Unknown to Patient Brother    ??? Cancer-Breast Maternal Grandmother    ??? Stroke Maternal Grandmother    ??? Heart Disease Maternal Grandfather    ??? Heart Disease Paternal Grandmother    ??? Heart Disease Paternal Grandfather      Social History     Socioeconomic History   ??? Marital status: Widowed     Spouse name: Not on file   ??? Number of children: Not on file   ??? Years of education: Not on file   ??? Highest education level: Not on file   Occupational History   ??? Not on file   Tobacco Use   ??? Smoking status: Never Smoker   ??? Smokeless tobacco: Never Used   Substance and Sexual Activity   ??? Alcohol use: Never     Frequency: Never   ??? Drug use: Never   ??? Sexual activity: Not on file   Other Topics Concern   ??? Not on file   Social History Narrative   ??? Not on file       Allergies   Allergen Reactions   ??? Codeine RASH   ??? Levaquin [Levofloxacin] RASH   ??? Norco [Hydrocodone-Acetaminophen] VOMITING       Objective:         ??? acetylcarnitine HCl (ACETYL L-CARNITINE PO) Take 400 mg by mouth daily.   ??? Alpha Lipoic Acid 200 mg tab Take 1 tablet by mouth daily.   ??? anastrozole (ARIMIDEX) 1 mg tablet Take one tablet by mouth daily. Indications: hormone receptor positive breast cancer   ??? APPLE CIDER VINEGAR PO Take  450 mg by mouth daily.   ??? ascorbic acid (VITAMIN C) 500 mg tablet Take 500 mg by mouth daily.   ??? astaxanthin 4 mg cap Take 1 capsule by mouth daily.   ??? atorvastatin (LIPITOR) 20 mg tablet Take 20 mg by mouth at bedtime daily.   ??? bumetanide (BUMEX) 1 mg tablet Take 1 mg by mouth daily.   ??? Calcium-Cholecalciferol (D3) (CALCIUM 600 + D(3)) 600 mg calcium- 200 unit cap Take 1 capsule by mouth twice daily.   ??? cyanocobalamin 1,000 mcg tablet Take 1,000 mcg by mouth daily. ??? docusate (COLACE) 100 mg capsule Take 100 mg by mouth at bedtime daily.   ??? fish oil /omega-3 fatty acids (SEA-OMEGA) 340/1000 mg capsule Take 1 capsule by mouth daily.   ??? fluticasone (FLONASE) 50 mcg/actuation nasal spray Apply 1 spray to each nostril as directed at bedtime daily. Shake bottle gently before using.    ??? gabapentin (NEURONTIN) 600 mg tablet Take 600 mg by mouth twice daily.   ??? levothyroxine (SYNTHROID) 100 mcg tablet Take 100 mcg by mouth daily 30 minutes before breakfast.   ??? loratadine (CLARITIN) 10 mg tablet Take 10 mg by mouth daily.   ??? Lysine 1,000 mg tab Take 1 tablet by mouth daily.   ??? melatonin 3 mg tab Take 6 mg by mouth at bedtime daily.   ??? metFORMIN-XR(+) (GLUCOPHAGE XR) 500 mg extended release tablet Take 500 mg by mouth daily.   ??? Milk Thistle 175 mg tab Take 1 tablet by mouth daily.   ??? oxybutynin XL (DITROPAN XL) 5 mg tablet Take 10 mg by mouth at bedtime daily.   ??? pramipexole (MIRAPEX) 0.5 mg tablet Take 0.5 mg by mouth at bedtime daily.   ??? selenium 200 mcg tab tablet Take 200 mcg by mouth at bedtime daily.   ??? vitamin E 400 unit capsule Take 400 Units by mouth daily.   ??? vitamins, multiple cap Take 1 capsule by mouth daily.     Vitals:    12/25/18 1432   BP: 130/61   BP Source: Arm, Left Upper   Patient Position: Sitting   Pulse: 56   Resp: 16   Temp: 36.8 ???C (98.2 ???F)   TempSrc: Temporal   SpO2: 99%   Weight: 107.9 kg (237 lb 12.8 oz)   Height: 157.5 cm (62.01)   PainSc: Zero     Body mass index is 43.48 kg/m???.     Pain Score: Zero       Fatigue Scale: 2    Pain Addressed:  N/A    Patient Evaluated for a Clinical Trial: No treatment clinical trial available for this patient.     [place] Cooperative Oncology Group performance status is 0, Fully active, able to carry on all pre-disease performance without restriction.Marland Kitchen     Physical Exam  Vitals signs reviewed.   Chest:                RIGHT BREAST EXAM:  Breast:  No palpable breast masses. No skin, nipple, or areolar change. Normal dense tissue in her area of palpable concern.  Skin Erythema:  No  Attachment of Overlying Skin:  No  Peau d' orange:  No  Chest Wall Attachment:  No  Nipple Inversion:  No  Nipple Discharge: No    LEFT BREAST EXAM:  Breast: Consistent with previous lumpectomy.  No palpable masses.   Skin Erythema:  No  Attachment of Overlying Skin:  No  Peau d' orange:  No  Chest Wall Attachment: No  Nipple Inversion:  No  Nipple Discharge:  No    RIGHT NODAL BASIN EXAM:  Axillary:  negative  Infraclavicular:  negative  Supraclavicular:  negative    LEFT NODAL BASIN EXAM:  Axillary:  negative  Infraclavicular: negative  Supraclavicular:  negative    Constitutional: No acute distress.  HEENT:  Head: Normocephalic and atraumatic.  Eyes: No discharge. No scleral icterus.  Pulmonary/Chest: No respiratory distress.   Lymphadenopathy: There is no axillary, infraclavicular, or supraclavicular adenopathy.  Neurological: Alert and oriented to person, place and time. No cranial nerve deficit.  Skin: Warm and dry. No rash noted. No erythema. No pallor.  Psychiatric: Normal mood and affect. Behavior is normal. Judgement and thought content normal.    Imaging Review:    [id number] MAMMO DIAGNOSTIC BIL/TOMO: December 25, 2018 - ACCESSION #:   [id number]  2D/3D Procedure  3D Routine views.  2D Routine views.    Prior study comparison: October 19, 2017, left breast [id number] MAMMO   DIAG LT, performed at [facility]. ???  October 05, 2017, mammogram. ???September 21, 2017, mammogram.    There are scattered areas of fibroglandular density. ???73 year old  female with a history of left breast cancer presenting for   mammogram following lumpectomy. The patient also reports a new   palpable right breast lump.    3D and synthetic 2D images were obtained bilaterally with   additional magnification views of the left breast. A triangular   marker on the right breast denotes the palpable area of concern. Interval postsurgical changes of lumpectomy in the left breast   are noted. Stable benign calcifications in the left breast. No   suspicious mass, calcifications, or architectural distortion.    No suspicious mass, calcifications, or architectural distortion   in the right breast. Targeted ultrasound of the right breast will  be performed.    [id number] US BREAST TARGET RT: RIGHT BREAST - December 25, 2018 -   ACCESSION #: [id number]  Targeted ultrasound of the right breast was performed in the area  of concern. No suspicious cystic or solid mass is identified.      Approved by Kathyrn Sheriff, M.D. on 12/25/2018 2:08 PM    By my electronic signature, I attest that I have personally   reviewed the images for this examination and formulated the   interpretations and opinions expressed in this report      Finalized by Jacqulyn Ducking, M.D. on 12/25/2018 2:23 PM. Dictated by  Kathyrn Sheriff, M.D. on 12/25/2018 1:45 PM.      Electronically signed and approved by: Jacqulyn Ducking, M.D.   [id number]  IMPRESSION    ASSESSMENT: BIRAD 2-Benign (Overall)  DIAG MAMMO BL/T: BIRAD 2-benign finding.  Right breast US TARGET RIGHT: Right breast is BIRAD 1-negative.        RECOMMENDATION:  Routine screening mammogram in 1 year.            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  Stage IA, pT1cN0 - NED    Ms. Dechert continues to do well.  There is no clinical evidence of recurrence.  Bilateral diagnostic mammogram and targeted right breast ultrasound performed today were reviewed and were benign.  She was reassured there were no worrisome findings in her area of palpable concern on her right breast.  There have been  no changes to her past medical or family history since her last visit other than the above mentioned shingles. She is tolerating Arimidex, and will continue to follow with Dr. Welton Flakes.  She will RTC in 1 year at the time of her next bilateral screening mammogram.  She will call in the interim with any questions or concerns. Ms. Homer was given ample time to ask questions all of which were answered to her satisfaction.     1.  Continue to follow with Dr. Welton Flakes  2.  Bilateral screening mammogram in 1 year  3.  RTC in 1 year    Scarlett Presto, [place]

## 2018-12-30 ENCOUNTER — Encounter: Admit: 2018-12-30 | Discharge: 2018-12-30

## 2018-12-30 NOTE — Telephone Encounter
Patient called requesting to reschedule her return appointment with Dr. Humphrey Rolls on 02/04/2019. The patient states that she just had her mammogram and return with Finis Bud on 12/25/2018 she would like to push Dr. Laurelyn Sickle appointment out two months since her mammo and return on 6/10 were delayed by two months. Patient's return with Dr. Humphrey Rolls has been rescheduled to 03/25/2019.

## 2019-03-23 ENCOUNTER — Encounter: Admit: 2019-03-23 | Discharge: 2019-03-23

## 2019-04-22 ENCOUNTER — Encounter: Admit: 2019-04-22 | Discharge: 2019-04-22 | Payer: BC Managed Care – PPO

## 2019-04-22 MED ORDER — ANASTROZOLE 1 MG PO TAB
1 mg | ORAL_TABLET | Freq: Every day | ORAL | 3 refills | 33.00000 days | Status: AC
Start: 2019-04-22 — End: ?

## 2019-07-23 ENCOUNTER — Encounter: Admit: 2019-07-23 | Discharge: 2019-07-23 | Payer: BC Managed Care – PPO

## 2019-07-23 ENCOUNTER — Encounter

## 2019-07-23 DIAGNOSIS — I10 Essential (primary) hypertension: Secondary | ICD-10-CM

## 2019-07-23 DIAGNOSIS — M47812 Spondylosis without myelopathy or radiculopathy, cervical region: Secondary | ICD-10-CM

## 2019-07-23 DIAGNOSIS — M720 Palmar fascial fibromatosis [Dupuytren]: Secondary | ICD-10-CM

## 2019-07-23 DIAGNOSIS — E119 Type 2 diabetes mellitus without complications: Secondary | ICD-10-CM

## 2019-07-23 DIAGNOSIS — M65841 Other synovitis and tenosynovitis, right hand: Secondary | ICD-10-CM

## 2019-07-23 DIAGNOSIS — H547 Unspecified visual loss: Secondary | ICD-10-CM

## 2019-07-23 DIAGNOSIS — J329 Chronic sinusitis, unspecified: Secondary | ICD-10-CM

## 2019-07-23 DIAGNOSIS — M67442 Ganglion, left hand: Secondary | ICD-10-CM

## 2019-07-23 DIAGNOSIS — R112 Nausea with vomiting, unspecified: Secondary | ICD-10-CM

## 2019-07-23 DIAGNOSIS — N3941 Urge incontinence: Secondary | ICD-10-CM

## 2019-07-23 DIAGNOSIS — M19049 Primary osteoarthritis, unspecified hand: Secondary | ICD-10-CM

## 2019-07-23 DIAGNOSIS — J479 Bronchiectasis, uncomplicated: Secondary | ICD-10-CM

## 2019-07-23 DIAGNOSIS — M5020 Other cervical disc displacement, unspecified cervical region: Secondary | ICD-10-CM

## 2019-07-23 DIAGNOSIS — R931 Abnormal findings on diagnostic imaging of heart and coronary circulation: Secondary | ICD-10-CM

## 2019-07-23 DIAGNOSIS — N200 Calculus of kidney: Secondary | ICD-10-CM

## 2019-07-23 DIAGNOSIS — G4733 Obstructive sleep apnea (adult) (pediatric): Secondary | ICD-10-CM

## 2019-07-23 DIAGNOSIS — I5189 Other ill-defined heart diseases: Secondary | ICD-10-CM

## 2019-07-23 DIAGNOSIS — B029 Zoster without complications: Secondary | ICD-10-CM

## 2019-07-23 DIAGNOSIS — C50919 Malignant neoplasm of unspecified site of unspecified female breast: Secondary | ICD-10-CM

## 2019-07-23 DIAGNOSIS — M65842 Other synovitis and tenosynovitis, left hand: Secondary | ICD-10-CM

## 2019-07-23 DIAGNOSIS — M1990 Unspecified osteoarthritis, unspecified site: Secondary | ICD-10-CM

## 2019-07-23 DIAGNOSIS — E069 Thyroiditis, unspecified: Secondary | ICD-10-CM

## 2019-07-23 DIAGNOSIS — D869 Sarcoidosis, unspecified: Secondary | ICD-10-CM

## 2019-07-23 DIAGNOSIS — E785 Hyperlipidemia, unspecified: Secondary | ICD-10-CM

## 2019-07-23 DIAGNOSIS — M545 Low back pain: Secondary | ICD-10-CM

## 2019-07-23 DIAGNOSIS — E039 Hypothyroidism, unspecified: Secondary | ICD-10-CM

## 2019-07-23 DIAGNOSIS — M65321 Trigger finger, right index finger: Secondary | ICD-10-CM

## 2019-07-23 DIAGNOSIS — M25561 Pain in right knee: Secondary | ICD-10-CM

## 2019-07-23 DIAGNOSIS — K760 Fatty (change of) liver, not elsewhere classified: Secondary | ICD-10-CM

## 2019-07-23 MED ORDER — LIDOCAINE HCL 10 MG/ML (1 %) IJ SOLN
1 mL | Freq: Once | 0 refills | Status: CP
Start: 2019-07-23 — End: ?
  Administered 2019-07-23: 21:00:00 1 mL

## 2019-07-23 MED ORDER — TRIAMCINOLONE ACETONIDE 40 MG/ML IJ SUSP
40 mg | Freq: Once | INTRAMUSCULAR | 0 refills | Status: CP
Start: 2019-07-23 — End: ?
  Administered 2019-07-23: 21:00:00 40 mg via INTRAMUSCULAR

## 2019-07-23 NOTE — Progress Notes
Date of Service: 07/23/2019    Subjective:             Alexandra Cobb is a 74 y.o. female.    History of Present Illness  Alexandra Cobb for follow-up.  I had last seen her in October 2019.  I have been treating her for a left index finger DIP mucous cyst on the radial side as well as Dupuytren's nodules.  Today she presents with pain with right index finger ROM and occasional locking.  She has difficulty making a full fist with the digit.  She also notes prominent nodules and more difficulty fully extending all her fingers.  She notes that she feels tightness.  Her health is otherwise unchanged.  There are no new medical problems.     Review of Systems   Constitutional: Negative.    HENT: Negative.    Eyes: Negative.    Respiratory: Negative.    Cardiovascular: Negative.    Gastrointestinal: Negative.    Endocrine: Negative.    Genitourinary: Negative.    Musculoskeletal: Negative.    Skin: Negative.    Allergic/Immunologic: Negative.    Neurological: Negative.    Hematological: Negative.    Psychiatric/Behavioral: Negative.          Objective:         ? acetylcarnitine HCl (ACETYL L-CARNITINE PO) Take 400 mg by mouth daily.   ? Alpha Lipoic Acid 200 mg tab Take 1 tablet by mouth daily.   ? anastrozole (ARIMIDEX) 1 mg tablet Take one tablet by mouth daily. Indications: hormone receptor positive breast cancer   ? APPLE CIDER VINEGAR PO Take 450 mg by mouth daily.   ? ascorbic acid (VITAMIN C) 500 mg tablet Take 500 mg by mouth daily.   ? astaxanthin 4 mg cap Take 1 capsule by mouth daily.   ? atorvastatin (LIPITOR) 20 mg tablet Take 20 mg by mouth at bedtime daily.   ? bumetanide (BUMEX) 1 mg tablet Take 1 mg by mouth daily.   ? Calcium-Cholecalciferol (D3) (CALCIUM 600 + D(3)) 600 mg calcium- 200 unit cap Take 1 capsule by mouth twice daily.   ? cyanocobalamin 1,000 mcg tablet Take 1,000 mcg by mouth daily.   ? docusate (COLACE) 100 mg capsule Take 100 mg by mouth at bedtime daily. ? fish oil /omega-3 fatty acids (SEA-OMEGA) 340/1000 mg capsule Take 1 capsule by mouth daily.   ? fluticasone (FLONASE) 50 mcg/actuation nasal spray Apply 1 spray to each nostril as directed at bedtime daily. Shake bottle gently before using.    ? gabapentin (NEURONTIN) 600 mg tablet Take 600 mg by mouth twice daily.   ? levothyroxine (SYNTHROID) 100 mcg tablet Take 100 mcg by mouth daily 30 minutes before breakfast.   ? loratadine (CLARITIN) 10 mg tablet Take 10 mg by mouth daily.   ? Lysine 1,000 mg tab Take 1 tablet by mouth daily.   ? melatonin 3 mg tab Take 6 mg by mouth at bedtime daily.   ? metFORMIN-XR(+) (GLUCOPHAGE XR) 500 mg extended release tablet Take 500 mg by mouth daily.   ? Milk Thistle 175 mg tab Take 1 tablet by mouth daily.   ? oxybutynin XL (DITROPAN XL) 5 mg tablet Take 10 mg by mouth at bedtime daily.   ? pramipexole (MIRAPEX) 0.5 mg tablet Take 0.5 mg by mouth at bedtime daily.   ? selenium 200 mcg tab tablet Take 200 mcg by mouth at bedtime daily.   ? vitamin E 400  unit capsule Take 400 Units by mouth daily.   ? vitamins, multiple cap Take 1 capsule by mouth daily.     Vitals:    07/23/19 1509   BP: (!) 154/64   BP Source: Arm, Right Upper   Patient Position: Sitting   Pulse: 81   Temp: 36.4 ?C (97.5 ?F)   TempSrc: Temporal   Height: 157.5 cm (62)   PainSc: Zero     Body mass index is 43.75 kg/m?Marland Kitchen     Physical Exam  Vitals signs and nursing note reviewed.   Constitutional:       Appearance: She is well-developed.   HENT:      Head: Normocephalic and atraumatic.   Eyes:      Conjunctiva/sclera: Conjunctivae normal.   Cardiovascular:      Rate and Rhythm: Normal rate.   Pulmonary:      Effort: Pulmonary effort is normal.   Musculoskeletal:         General: Swelling present. Comments: Right index finger with tenderness and pain over the A1 pulley.  Positive crepitus and locking intermittently.  Moderate edema.  Mid palmar nodule over the ring finger on the right and the left with the left nodule being a little more superficial.  She is able to fully extend her digits and place the palms flat on the surface.   Skin:     General: Skin is warm and dry.   Neurological:      Mental Status: She is alert and oriented to person, place, and time.   Psychiatric:         Behavior: Behavior normal.         Thought Content: Thought content normal.         Judgment: Judgment normal.          Assessment and Plan:  74 year old woman with Dupuytren's nodules and stenosing tenosynovitis of the right index finger.  The pathophysiology of both disease processes was discussed.  Treatment options were discussed.  She opted to have steroid injections into the region of the A1 pulley and tendon sheath of the right index finger as well as into the palm nodules.  This was performed today.  She tolerated it well.  Post procedure expectations and limitations were reviewed.  All of her questions were answered.  She can follow-up with me on a as needed basis.

## 2019-09-14 ENCOUNTER — Encounter: Admit: 2019-09-14 | Discharge: 2019-09-14 | Payer: BC Managed Care – PPO

## 2019-09-26 ENCOUNTER — Encounter: Admit: 2019-09-26 | Discharge: 2019-09-26 | Payer: BC Managed Care – PPO

## 2019-09-30 ENCOUNTER — Encounter: Admit: 2019-09-30 | Discharge: 2019-09-30 | Payer: BC Managed Care – PPO

## 2019-09-30 NOTE — Telephone Encounter
Patient called in and had questions about an app. I advised pt. I did get a hold of karen Dr. Laurelyn Sickle nurse and she said she would reach out to her.

## 2019-10-21 ENCOUNTER — Encounter: Admit: 2019-10-21 | Discharge: 2019-10-21 | Payer: BC Managed Care – PPO

## 2019-10-21 DIAGNOSIS — M47812 Spondylosis without myelopathy or radiculopathy, cervical region: Secondary | ICD-10-CM

## 2019-10-21 DIAGNOSIS — E069 Thyroiditis, unspecified: Secondary | ICD-10-CM

## 2019-10-21 DIAGNOSIS — R931 Abnormal findings on diagnostic imaging of heart and coronary circulation: Secondary | ICD-10-CM

## 2019-10-21 DIAGNOSIS — M5020 Other cervical disc displacement, unspecified cervical region: Secondary | ICD-10-CM

## 2019-10-21 DIAGNOSIS — B029 Zoster without complications: Secondary | ICD-10-CM

## 2019-10-21 DIAGNOSIS — D869 Sarcoidosis, unspecified: Secondary | ICD-10-CM

## 2019-10-21 DIAGNOSIS — N3941 Urge incontinence: Secondary | ICD-10-CM

## 2019-10-21 DIAGNOSIS — G4733 Obstructive sleep apnea (adult) (pediatric): Secondary | ICD-10-CM

## 2019-10-21 DIAGNOSIS — Z79811 Long term (current) use of aromatase inhibitors: Secondary | ICD-10-CM

## 2019-10-21 DIAGNOSIS — M25561 Pain in right knee: Secondary | ICD-10-CM

## 2019-10-21 DIAGNOSIS — M545 Low back pain: Secondary | ICD-10-CM

## 2019-10-21 DIAGNOSIS — I5189 Other ill-defined heart diseases: Secondary | ICD-10-CM

## 2019-10-21 DIAGNOSIS — J329 Chronic sinusitis, unspecified: Secondary | ICD-10-CM

## 2019-10-21 DIAGNOSIS — M19049 Primary osteoarthritis, unspecified hand: Secondary | ICD-10-CM

## 2019-10-21 DIAGNOSIS — Z1231 Encounter for screening mammogram for malignant neoplasm of breast: Secondary | ICD-10-CM

## 2019-10-21 DIAGNOSIS — C50912 Malignant neoplasm of unspecified site of left female breast: Secondary | ICD-10-CM

## 2019-10-21 DIAGNOSIS — I10 Essential (primary) hypertension: Secondary | ICD-10-CM

## 2019-10-21 DIAGNOSIS — H547 Unspecified visual loss: Secondary | ICD-10-CM

## 2019-10-21 DIAGNOSIS — M720 Palmar fascial fibromatosis [Dupuytren]: Secondary | ICD-10-CM

## 2019-10-21 DIAGNOSIS — E119 Type 2 diabetes mellitus without complications: Secondary | ICD-10-CM

## 2019-10-21 DIAGNOSIS — N200 Calculus of kidney: Secondary | ICD-10-CM

## 2019-10-21 DIAGNOSIS — M65842 Other synovitis and tenosynovitis, left hand: Secondary | ICD-10-CM

## 2019-10-21 DIAGNOSIS — E039 Hypothyroidism, unspecified: Secondary | ICD-10-CM

## 2019-10-21 DIAGNOSIS — R112 Nausea with vomiting, unspecified: Secondary | ICD-10-CM

## 2019-10-21 DIAGNOSIS — J479 Bronchiectasis, uncomplicated: Secondary | ICD-10-CM

## 2019-10-21 DIAGNOSIS — M1990 Unspecified osteoarthritis, unspecified site: Secondary | ICD-10-CM

## 2019-10-21 DIAGNOSIS — K760 Fatty (change of) liver, not elsewhere classified: Secondary | ICD-10-CM

## 2019-10-21 DIAGNOSIS — E785 Hyperlipidemia, unspecified: Secondary | ICD-10-CM

## 2019-10-21 DIAGNOSIS — M65841 Other synovitis and tenosynovitis, right hand: Secondary | ICD-10-CM

## 2019-10-21 DIAGNOSIS — C50919 Malignant neoplasm of unspecified site of unspecified female breast: Secondary | ICD-10-CM

## 2019-10-21 DIAGNOSIS — M67442 Ganglion, left hand: Secondary | ICD-10-CM

## 2019-11-17 ENCOUNTER — Encounter: Admit: 2019-11-17 | Discharge: 2019-11-17 | Payer: BC Managed Care – PPO

## 2019-11-18 ENCOUNTER — Encounter: Admit: 2019-11-18 | Discharge: 2019-11-18 | Payer: BC Managed Care – PPO

## 2019-11-18 ENCOUNTER — Ambulatory Visit: Admit: 2019-11-18 | Discharge: 2019-11-18 | Payer: BC Managed Care – PPO

## 2019-11-18 MED ORDER — DIPHENHYDRAMINE HCL 50 MG/ML IJ SOLN
25 mg | Freq: Once | INTRAVENOUS | 0 refills | Status: DC | PRN
Start: 2019-11-18 — End: 2019-11-18

## 2019-11-18 MED ORDER — HALOPERIDOL LACTATE 5 MG/ML IJ SOLN
1 mg | Freq: Once | INTRAVENOUS | 0 refills | Status: DC | PRN
Start: 2019-11-18 — End: 2019-11-18

## 2019-11-18 MED ORDER — ONDANSETRON HCL (PF) 4 MG/2 ML IJ SOLN
0 refills | Status: DC
Start: 2019-11-18 — End: 2019-11-18
  Administered 2019-11-18: 15:00:00 4 mg via INTRAVENOUS

## 2019-11-18 MED ORDER — HYDROMORPHONE (PF) 2 MG/ML IJ SYRG
.5-1 mg | INTRAVENOUS | 0 refills | Status: DC | PRN
Start: 2019-11-18 — End: 2019-11-18

## 2019-11-18 MED ORDER — LIDOCAINE (PF) 10 MG/ML (1 %) IJ SOLN
SUBCUTANEOUS | 0 refills | Status: CP
Start: 2019-11-18 — End: ?
  Administered 2019-11-18: 14:00:00 3 mL via SUBCUTANEOUS

## 2019-11-18 MED ORDER — GABAPENTIN 300 MG PO CAP
300 mg | Freq: Once | ORAL | 0 refills | Status: DC
Start: 2019-11-18 — End: 2019-11-18

## 2019-11-18 MED ORDER — FENTANYL CITRATE (PF) 50 MCG/ML IJ SOLN
50-100 ug | INTRAVENOUS | 0 refills | Status: DC | PRN
Start: 2019-11-18 — End: 2019-11-18

## 2019-11-18 MED ORDER — LIDOCAINE (PF) 200 MG/10 ML (2 %) IJ SYRG
0 refills | Status: DC
Start: 2019-11-18 — End: 2019-11-18
  Administered 2019-11-18: 15:00:00 100 mg via INTRAVENOUS

## 2019-11-18 MED ORDER — PROPOFOL INJ 10 MG/ML IV VIAL
0 refills | Status: DC
Start: 2019-11-18 — End: 2019-11-18
  Administered 2019-11-18: 15:00:00 20 mg via INTRAVENOUS
  Administered 2019-11-18 (×2): 50 mg via INTRAVENOUS

## 2019-11-18 MED ORDER — ACETAMINOPHEN 500 MG PO TAB
1000 mg | Freq: Once | ORAL | 0 refills | Status: DC
Start: 2019-11-18 — End: 2019-11-18

## 2019-11-18 MED ORDER — SODIUM CHLORIDE 0.9 % IR SOLN
0 refills | Status: DC
Start: 2019-11-18 — End: 2019-11-18
  Administered 2019-11-18: 15:00:00 1000 mL

## 2019-11-18 MED ORDER — CEFAZOLIN 1 GRAM IJ SOLR
0 refills | Status: DC
Start: 2019-11-18 — End: 2019-11-18
  Administered 2019-11-18: 15:00:00 3 g via INTRAVENOUS

## 2019-11-18 MED ORDER — DEXAMETHASONE SODIUM PHOSPHATE 10 MG/ML IJ SOLN
0 refills | Status: CP
Start: 2019-11-18 — End: ?
  Administered 2019-11-18: 14:00:00 4 mg

## 2019-11-18 MED ORDER — OXYCODONE 5 MG PO TAB
5-10 mg | Freq: Once | ORAL | 0 refills | Status: DC | PRN
Start: 2019-11-18 — End: 2019-11-18

## 2019-11-18 MED ORDER — PROMETHAZINE 25 MG/ML IJ SOLN
6.25 mg | INTRAVENOUS | 0 refills | Status: DC | PRN
Start: 2019-11-18 — End: 2019-11-18

## 2019-11-18 MED ORDER — BACITRACIN ZINC 500 UNIT/GRAM TP OINT
0 refills | Status: DC
Start: 2019-11-18 — End: 2019-11-18
  Administered 2019-11-18: 15:00:00 1 via TOPICAL

## 2019-11-18 MED ORDER — OXYCODONE 5 MG PO TAB
5 mg | ORAL_TABLET | ORAL | 0 refills | 6.00000 days | Status: DC | PRN
Start: 2019-11-18 — End: 2019-12-31

## 2019-11-18 MED ORDER — BUPIVACAINE HCL 0.5 % (5 MG/ML) IJ SOLN
0 refills | Status: CP
Start: 2019-11-18 — End: ?
  Administered 2019-11-18: 14:00:00 30 mL

## 2019-11-18 MED ORDER — LACTATED RINGERS IV SOLP
INTRAVENOUS | 0 refills | Status: DC
Start: 2019-11-18 — End: 2019-11-18

## 2019-11-18 MED ORDER — PROPOFOL 10 MG/ML IV EMUL 100 ML (INFUSION)(AM)(OR)
0 refills | Status: DC
Start: 2019-11-18 — End: 2019-11-18
  Administered 2019-11-18: 15:00:00 50 ug/kg/min via INTRAVENOUS

## 2019-11-19 ENCOUNTER — Encounter: Admit: 2019-11-19 | Discharge: 2019-11-19 | Payer: BC Managed Care – PPO

## 2019-12-01 ENCOUNTER — Encounter: Admit: 2019-12-01 | Discharge: 2019-12-01 | Payer: BC Managed Care – PPO

## 2019-12-03 ENCOUNTER — Encounter: Admit: 2019-12-03 | Discharge: 2019-12-03 | Payer: BC Managed Care – PPO

## 2019-12-24 ENCOUNTER — Encounter: Admit: 2019-12-24 | Discharge: 2019-12-24 | Payer: BC Managed Care – PPO

## 2019-12-31 ENCOUNTER — Encounter: Admit: 2019-12-31 | Discharge: 2019-12-31 | Payer: BC Managed Care – PPO

## 2019-12-31 DIAGNOSIS — M25561 Pain in right knee: Secondary | ICD-10-CM

## 2019-12-31 DIAGNOSIS — E069 Thyroiditis, unspecified: Secondary | ICD-10-CM

## 2019-12-31 DIAGNOSIS — M720 Palmar fascial fibromatosis [Dupuytren]: Secondary | ICD-10-CM

## 2019-12-31 DIAGNOSIS — D869 Sarcoidosis, unspecified: Secondary | ICD-10-CM

## 2019-12-31 DIAGNOSIS — I5189 Other ill-defined heart diseases: Secondary | ICD-10-CM

## 2019-12-31 DIAGNOSIS — K760 Fatty (change of) liver, not elsewhere classified: Secondary | ICD-10-CM

## 2019-12-31 DIAGNOSIS — C50919 Malignant neoplasm of unspecified site of unspecified female breast: Secondary | ICD-10-CM

## 2019-12-31 DIAGNOSIS — M67442 Ganglion, left hand: Secondary | ICD-10-CM

## 2019-12-31 DIAGNOSIS — R931 Abnormal findings on diagnostic imaging of heart and coronary circulation: Secondary | ICD-10-CM

## 2019-12-31 DIAGNOSIS — H547 Unspecified visual loss: Secondary | ICD-10-CM

## 2019-12-31 DIAGNOSIS — C50412 Malignant neoplasm of upper-outer quadrant of left female breast: Secondary | ICD-10-CM

## 2019-12-31 DIAGNOSIS — M1990 Unspecified osteoarthritis, unspecified site: Secondary | ICD-10-CM

## 2019-12-31 DIAGNOSIS — M5020 Other cervical disc displacement, unspecified cervical region: Secondary | ICD-10-CM

## 2019-12-31 DIAGNOSIS — M65842 Other synovitis and tenosynovitis, left hand: Secondary | ICD-10-CM

## 2019-12-31 DIAGNOSIS — E119 Type 2 diabetes mellitus without complications: Secondary | ICD-10-CM

## 2019-12-31 DIAGNOSIS — J329 Chronic sinusitis, unspecified: Secondary | ICD-10-CM

## 2019-12-31 DIAGNOSIS — J479 Bronchiectasis, uncomplicated: Secondary | ICD-10-CM

## 2019-12-31 DIAGNOSIS — N3941 Urge incontinence: Secondary | ICD-10-CM

## 2019-12-31 DIAGNOSIS — M47812 Spondylosis without myelopathy or radiculopathy, cervical region: Secondary | ICD-10-CM

## 2019-12-31 DIAGNOSIS — E785 Hyperlipidemia, unspecified: Secondary | ICD-10-CM

## 2019-12-31 DIAGNOSIS — M19049 Primary osteoarthritis, unspecified hand: Secondary | ICD-10-CM

## 2019-12-31 DIAGNOSIS — G4733 Obstructive sleep apnea (adult) (pediatric): Secondary | ICD-10-CM

## 2019-12-31 DIAGNOSIS — R112 Nausea with vomiting, unspecified: Secondary | ICD-10-CM

## 2019-12-31 DIAGNOSIS — N200 Calculus of kidney: Secondary | ICD-10-CM

## 2019-12-31 DIAGNOSIS — E039 Hypothyroidism, unspecified: Secondary | ICD-10-CM

## 2019-12-31 DIAGNOSIS — M545 Low back pain: Secondary | ICD-10-CM

## 2019-12-31 DIAGNOSIS — Z79811 Long term (current) use of aromatase inhibitors: Secondary | ICD-10-CM

## 2019-12-31 DIAGNOSIS — Z1239 Encounter for other screening for malignant neoplasm of breast: Secondary | ICD-10-CM

## 2019-12-31 DIAGNOSIS — Z08 Encounter for follow-up examination after completed treatment for malignant neoplasm: Secondary | ICD-10-CM

## 2019-12-31 DIAGNOSIS — B029 Zoster without complications: Secondary | ICD-10-CM

## 2019-12-31 DIAGNOSIS — I10 Essential (primary) hypertension: Secondary | ICD-10-CM

## 2019-12-31 DIAGNOSIS — M65841 Other synovitis and tenosynovitis, right hand: Secondary | ICD-10-CM

## 2019-12-31 NOTE — Progress Notes
Name: Alexandra Cobb          MRN: [id number]      DOB: July 20, 1945      AGE: 74 y.o.   DATE OF SERVICE: 12/31/2019    Subjective:             Reason for Visit:  Heme/Onc Care      Alexandra Cobb is a 73 y.o. female.     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  She is almost 2.5 years out from diagnosis.  She is doing well and has no breast or systemic concerns.      HISTORY:  Alexandra Cobb is a Caucaisan female who presented to the [facility] on 10/16/2017 at age 33 for surgical evaluation of her newly diagnosed breast cancer.  Alexandra Cobb had no breast concerns such as palpable masses, skin changes, or nipple discharge prior to screening mammogram which revealed an asymmetric density in the left breast at 2:00.  A concerning mass was identified in this location on left diagnostic mammogram and left breast ultrasound, and biopsy was recommended.  Ultrasound guided left breast biopsy on 10/08/17 [facility]) revealed grade 2 IDC with DCIS.  Alexandra Cobb reports a history of a left breast excisional biopsy in 1989 at [facility] that was benign. She denies any other history of breast issues, surgeries or biopsies.   Alexandra Cobb underwent left RSL lumpectomy on 10/24/17, and final pathology revealed 1.6 cm grade 2 IDC; DCIS present; LVI not definitely identified; margins clear.  She declined radiation therapy after meeting with Dr. Clovis Riley.  She started Arimidex in 10/2017.    BREAST IMAGING:  Mammogram:    -- Bilateral screening mammogram 09/21/17 [facility]) revealed benign calcifications present without suspicious calcifications.  There was a developing asymmetric density within the upper outer left breast 10-11 cm FTN near the 2;00 position.  No other suspicious mass, asymmetric density, or architectural distortion.  -- Left diagnostic mammogram 09/21/17 [facility]) revealed a spiculated mass in the upper outer left breast corresponding to the area of mammographic concern.  Ultrasound:    -- Left breast ultrasound 10/05/17 [facility]) revealed in the area of concern, there was an irregularly marginated mass measuring 0.6 x 0.9 x 1.1 cm which was taller than wide in the 2:00 position, 11 cm FTN.  The remaining breast was unremarkable.  Biopsy was recommended.  BIRADS 5.  -- Targeted left breast ultrasound 10/16/17 ([place]) revealed an irregular hypoechoic mass with echogenic focus consistent with biopsy marker in the 2:00 position of the breast, 11 cm from the nipple. This measured 0.8 cm radial by 1.1 cm antiradial by 1.0 cm. It had a maximum transverse dimension of 1.2 cm. No morphologically abnormal lymph nodes were identified in the left axilla. 5 benign-appearing lymph nodes were seen. Impression: 1.2 cm irregular mass in the 2:00 position left breast consistent biopsy-proven malignancy.  REPRODUCTIVE HEALTH:  Age at first Menarche:  89  Age at First Live Birth:  81  Age at Menopause: 41.  Used HRT briefly   Gravida:  1  Para: 1  Breastfeeding:  No    PROCEDURE:   1.  Left breast excisional biopsy, 1989 [facility])  2.  Left RSL lumpectomy, 10/24/17 [facility])  PERTINENT PMH:  Sarcoidosis, OSA (uses CPAP), HTN, DM2  FAMILY HISTORY:  Alexandra Cobb-Breast cancer diagnosed at age 9. Alexandra Cobb-Breast cancer  PHYSICAL EXAM on PRESENTATION:  Biopsy skin changes noted in the left UOQ.  Well healed previous excisional biopsy scar on the left breast. No palpable breast masses. No skin, nipple, or areolar change. No supraclavicular, infraclavicular, or axillary adenopathy.   MEDICAL ONCOLOGY: Dr. Welton Flakes  PRESENT THERAPY:  Arimidex started in 10/2017  REFERRED BY:  Self referral         Review of Systems    Constitutional: Appetite change, unexpected weigh gain. Negative for fever, chills, and fatigue.   HENT: Hearing loss. Negative for congestion, rhinorrhea and tinnitus.    Eyes: Negative for pain, discharge and itching.   Respiratory: Negative for cough, chest tightness and shortness of breath.    Cardiovascular: Negative for chest pain and palpitations.   Gastrointestinal: Negative for abdominal distention, pain, nausea, vomiting, and diarrhea.   Genitourinary: Incontinence. Negative for frequency, vaginal bleeding, difficulty urinating and pelvic pain.   Musculoskeletal: Negative for myalgias, back pain, joint swelling and arthralgias.   Skin: Negative for rash.   Neurological: Negative for dizziness, weakness, light-headedness and headaches.   Hematological: Does not bruise/bleed easily.   Psychiatric/Behavioral: Negative for disturbed wake/sleep cycle. The patient is not nervous/anxious.    The following medical/surgical/family/social history and the list of medications are current, as of 12/31/2019    Medical History:   Diagnosis Date   ? Acquired hypothyroidism    ? Breast cancer (HCC) 2019    left breast   ? Bronchiectasis (HCC) 2016   ? Bulge of cervical disc without myelopathy     C6-7   ? Chronic sinusitis    ? Diabetes mellitus (HCC)     type 2   ? Diastolic dysfunction     per pt   ? Digital mucous cyst of finger of left hand 12/19/2017   ? Dupuytren's disease of palm 06/12/2016   ? Echocardiogram abnormal 03/20/2013    left side mild diastolic failure dysfunction not relaxing   ? Fatty liver disease, nonalcoholic    ? Hand arthritis 01/02/2018   ? Hyperlipidemia    ? Hypertension    ? Hypothyroidism 1993   ? Kidney stone 08/08/2014    2mm   ? Lower back pain    ? OSA on CPAP 1999    w/CPAP   ? Osteoarthritis     left hand   ? PONV (postoperative nausea and vomiting)    ? Right knee pain     injections x3   ? Sarcoidosis 1995   ? Shingles 11/06/2016    back of neck   ? Spondylosis of cervical spine     C5-6   ? Steatosis of liver 06/2015   ? Stenosing tenosynovitis of finger of left hand 05/08/2018   ? Stenosing tenosynovitis of finger of right hand 07/23/2019   ? Thyroiditis    ? Urge incontinence 01/15/2016   ? Vision decreased      Surgical History:   Procedure Laterality Date   ? TONSILLECTOMY  1953   ?  HX DILATION AND CURETTAGE  1992   ? STERNUM SURGERY  09/26/1991    removal of thyroid mass size of hard ball.   ? HX CARPAL TUNNEL RELEASE Bilateral 1997   ? KNEE REPLACEMENT Left 02/2013   ? TRANSTHORACIC ECHOCARDIOGRAM  03/20/2013   ? COLONOSCOPY  2015   ? HAND FUSION Right 2017    Thumb   ? HX HAND SURGERY Right 2017    [facility] Right hand broke bone spur   ? HX HAND SURGERY Left 2017    [facility] bone spur   ? TOE SURGERY Bilateral 2017    great toes shortened; toes straightened   ? SHOULDER ARTHROSCOPY Right 04/27/2016    removed long pointed bone spur   ? HX ROTATOR CUFF REPAIR Left 06/29/2016   ? TOE SURGERY Right 11/17/2016    right foot and plantar mass reduction   ? LEFT RADIOACTIVE SEED LOCALIZED LUMPECTOMY Left 10/24/2017    Performed by Cordelia Poche, MD at [facility] OR   ? EXCISION MUCOUS CYST LEFT INDEX FINGER Left 12/25/2017    Performed by Sharyn Blitz, MD at [facility] OR   ? EXCISION LESION TENDON SHEATH/ JOINT CAPSULE HAND/ FINGER Right 11/18/2019    Performed by Marylene Land, MD at [facility] OR   ? PARTIAL EXCISION BONE - DISTAL PHALANX OF FINGER Right 11/18/2019    Performed by Marylene Land, MD at [facility] OR   ? INCISION TENDON SHEATH Right 11/18/2019    Performed by Marylene Land, MD at [facility] OR   ? BLADDER SUSPENSION     ? BREAST LUMPECTOMY Left    ? FALLOPIAN TUBE OCCLUSION  ~1978     Family History   Problem Relation Age of Onset   ? Cancer-Breast Alexandra Cobb         age 60 and contralateral breast at age 59   ? Cancer-Colon Alexandra Cobb    ? Cancer Alexandra Cobb    ? Thyroid Disease Alexandra Cobb    ? Heart Disease Father    ? Hypertension Sister    ? Thyroid Disease Sister    ? Unknown to Patient Brother    ? Cancer-Breast Alexandra Cobb    ? Stroke Alexandra Cobb    ? Heart Disease Alexandra Grandfather    ? Heart Disease Paternal Cobb    ? Heart Disease Paternal Grandfather      Social History     Socioeconomic History   ? Marital status: Widowed     Spouse name: Not on file   ? Number of children: Not on file   ? Years of education: Not on file   ? Highest education level: Not on file   Occupational History   ? Not on file   Tobacco Use   ? Smoking status: Never Smoker   ? Smokeless tobacco: Never Used   Substance and Sexual Activity   ? Alcohol use: Never   ? Drug use: Never   ? Sexual activity: Not on file   Other Topics Concern   ? Not on file   Social History Narrative   ? Not on file       Allergies   Allergen Reactions   ? Codeine RASH   ? Levaquin [Levofloxacin] RASH   ? Norco [Hydrocodone-Acetaminophen] VOMITING       Objective:         ? acetylcarnitine HCl (ACETYL L-CARNITINE PO) Take 400 mg by mouth daily.   ? Alpha Lipoic Acid 200 mg tab Take 1  tablet by mouth daily.   ? anastrozole (ARIMIDEX) 1 mg tablet Take one tablet by mouth daily. Indications: hormone receptor positive breast cancer   ? ascorbic acid (VITAMIN C) 500 mg tablet Take 500 mg by mouth daily.   ? aspirin 325 mg tablet Take 325 mg by mouth daily. Take with food.   ? astaxanthin 4 mg cap Take 1 capsule by mouth daily.   ? atorvastatin (LIPITOR) 20 mg tablet Take 20 mg by mouth at bedtime daily.   ? bumetanide (BUMEX) 1 mg tablet Take 1 mg by mouth daily.   ? Calcium-Cholecalciferol (D3) (CALCIUM 600 + D(3)) 600 mg calcium- 200 unit cap Take 1 capsule by mouth twice daily.   ? cyanocobalamin 1,000 mcg tablet Take 1,000 mcg by mouth daily.   ? docusate (COLACE) 100 mg capsule Take 100 mg by mouth at bedtime daily.   ? fish oil /omega-3 fatty acids (SEA-OMEGA) 340/1000 mg capsule Take 1 capsule by mouth daily.   ? fluticasone (FLONASE) 50 mcg/actuation nasal spray Apply 1 spray to each nostril as directed at bedtime daily. Shake bottle gently before using.    ? gabapentin (NEURONTIN) 600 mg tablet Take 600 mg by mouth twice daily.   ? levothyroxine (SYNTHROID) 100 mcg tablet Take 100 mcg by mouth daily 30 minutes before breakfast.   ? lisinopriL (ZESTRIL) 5 mg tablet Take 5 mg by mouth daily.   ? loratadine (CLARITIN) 10 mg tablet Take 10 mg by mouth daily.   ? Lysine 1,000 mg tab Take 1 tablet by mouth daily.   ? melatonin 3 mg tab Take 6 mg by mouth at bedtime daily.   ? metFORMIN-XR(+) (GLUCOPHAGE XR) 500 mg extended release tablet Take 500 mg by mouth twice daily as needed.   ? Milk Thistle 175 mg tab Take 1 tablet by mouth daily.   ? oxybutynin XL (DITROPAN XL) 5 mg tablet Take 10 mg by mouth at bedtime daily.   ? pramipexole (MIRAPEX) 0.5 mg tablet Take 0.5 mg by mouth at bedtime daily.   ? selenium 200 mcg tab tablet Take 200 mcg by mouth at bedtime daily.   ? semaglutide (OZEMPIC) 0.25 mg or 0.5 mg(2 mg/1.5 mL) injection PEN Inject  under the skin every 7 days.   ? vitamin E 400 unit capsule Take 400 Units by mouth daily.   ? vitamins, multiple cap Take 1 capsule by mouth daily.     Vitals:    12/31/19 1151   BP: 132/68   BP Source: Arm, Right Upper   Patient Position: Sitting   Pulse: 79   Resp: 16   Temp: 36.4 ?C (97.5 ?F)   TempSrc: Oral   SpO2: 100%   Weight: 115 kg (253 lb 9.6 oz)   Height: 157.5 cm (62.01)   PainSc: Zero     Body mass index is 46.37 kg/m?Marland Kitchen     Pain Score: Zero       Fatigue Scale: 0-None    Pain Addressed:  N/A    Patient Evaluated for a Clinical Trial: No treatment clinical trial available for this patient.     [place] Cooperative Oncology Group performance status is 0, Fully active, able to carry on all pre-disease performance without restriction.Marland Kitchen     Physical Exam  Vitals reviewed.   Chest: RIGHT BREAST EXAM:  Breast:  No palpable breast masses. No skin, nipple, or areolar change.   Skin Erythema:  No  Attachment of Overlying Skin:  No  Peau d'  orange:  No  Chest Wall Attachment:  No  Nipple Inversion:  No  Nipple Discharge: No    LEFT BREAST EXAM:  Breast: Consistent with previous lumpectomy.  No palpable masses.   Skin Erythema:  No  Attachment of Overlying Skin:  No  Peau d' orange:  No  Chest Wall Attachment: No  Nipple Inversion:  No  Nipple Discharge:  No    RIGHT NODAL BASIN EXAM:  Axillary:  negative  Infraclavicular:  negative  Supraclavicular:  negative    LEFT NODAL BASIN EXAM:  Axillary:  negative  Infraclavicular: negative  Supraclavicular:  positive    Constitutional: No acute distress.  HEENT:  Head: Normocephalic and atraumatic.  Eyes: No discharge. No scleral icterus.  Pulmonary/Chest: No respiratory distress.   Lymphadenopathy: There is no axillary, infraclavicular, or supraclavicular adenopathy.  Neurological: Alert and oriented to person, place and time. No cranial nerve deficit.  Skin: Warm and dry. No rash noted. No erythema. No pallor.  Psychiatric: Normal mood and affect. Behavior is normal. Judgement and thought content normal.    Imaging Review:    [id number] DIGITAL MAMMO SCREEN BILAT/TOMO/CAD: December 31, 2019 -   ACCESSION #: [id number]   3D Procedure   3D Routine views.   2D Synthetic Routine views.     The breasts are almost entirely fatty. ?2D, and 3D images were   obtained. Post therapeutic changes are seen. No masses, densities   or calcifications to suggest malignancy. No change when compared   to prior studies.       Electronically signed and approved by: Rayfield Citizen, M.D.   [id number]   IMPRESSION     ASSESSMENT: BIRAD 2-Benign         RECOMMENDATION:   Routine screening mammogram in 1 year.          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  Stage IA, pT1cN0 - NED Ms. Hoar continues to do well.  There is no clinical evidence of recurrence.  There have been no changes to her past medical or family history since her last visit.   She had a bilateral screening mammogram today that was reviewed and was benign. She is tolerating Arimidex, and will continue to follow with Dr. Welton Flakes.  Ms. Redlich no longer needs to follow in our clinic on a scheduled basis.  Dr. Welton Flakes can order her mammograms as long as he continues to follow with her.  She may then transition to Survivorship if desired. She does report she may be moving to [place] later this year to live with her granddaughter, and we will assist with her transition of care if needed.  She will RTC prn. Ms. Witbeck was given ample time to ask questions all of which were answered to her satisfaction.     1.  Continue to follow with Dr. Welton Flakes  2.  Bilateral screening mammogram in 1 year - ordered by Dr. Welton Flakes (or new institution if she relocates)  3.  RTC prn    Scarlett Presto, PA-C

## 2020-01-14 ENCOUNTER — Encounter: Admit: 2020-01-14 | Discharge: 2020-01-14 | Payer: BC Managed Care – PPO

## 2020-01-14 ENCOUNTER — Ambulatory Visit: Admit: 2020-01-14 | Discharge: 2020-01-14 | Payer: BC Managed Care – PPO

## 2020-01-14 DIAGNOSIS — M65842 Other synovitis and tenosynovitis, left hand: Secondary | ICD-10-CM

## 2020-01-14 DIAGNOSIS — I5189 Other ill-defined heart diseases: Secondary | ICD-10-CM

## 2020-01-14 DIAGNOSIS — N3941 Urge incontinence: Secondary | ICD-10-CM

## 2020-01-14 DIAGNOSIS — E785 Hyperlipidemia, unspecified: Secondary | ICD-10-CM

## 2020-01-14 DIAGNOSIS — M65841 Other synovitis and tenosynovitis, right hand: Secondary | ICD-10-CM

## 2020-01-14 DIAGNOSIS — G4733 Obstructive sleep apnea (adult) (pediatric): Secondary | ICD-10-CM

## 2020-01-14 DIAGNOSIS — E039 Hypothyroidism, unspecified: Secondary | ICD-10-CM

## 2020-01-14 DIAGNOSIS — E119 Type 2 diabetes mellitus without complications: Secondary | ICD-10-CM

## 2020-01-14 DIAGNOSIS — M25561 Pain in right knee: Secondary | ICD-10-CM

## 2020-01-14 DIAGNOSIS — M5020 Other cervical disc displacement, unspecified cervical region: Secondary | ICD-10-CM

## 2020-01-14 DIAGNOSIS — I10 Essential (primary) hypertension: Secondary | ICD-10-CM

## 2020-01-14 DIAGNOSIS — H547 Unspecified visual loss: Secondary | ICD-10-CM

## 2020-01-14 DIAGNOSIS — R931 Abnormal findings on diagnostic imaging of heart and coronary circulation: Secondary | ICD-10-CM

## 2020-01-14 DIAGNOSIS — M47812 Spondylosis without myelopathy or radiculopathy, cervical region: Secondary | ICD-10-CM

## 2020-01-14 DIAGNOSIS — B029 Zoster without complications: Secondary | ICD-10-CM

## 2020-01-14 DIAGNOSIS — M545 Low back pain: Secondary | ICD-10-CM

## 2020-01-14 DIAGNOSIS — J479 Bronchiectasis, uncomplicated: Secondary | ICD-10-CM

## 2020-01-14 DIAGNOSIS — M67442 Ganglion, left hand: Secondary | ICD-10-CM

## 2020-01-14 DIAGNOSIS — K760 Fatty (change of) liver, not elsewhere classified: Secondary | ICD-10-CM

## 2020-01-14 DIAGNOSIS — E069 Thyroiditis, unspecified: Secondary | ICD-10-CM

## 2020-01-14 DIAGNOSIS — C50919 Malignant neoplasm of unspecified site of unspecified female breast: Secondary | ICD-10-CM

## 2020-01-14 DIAGNOSIS — M720 Palmar fascial fibromatosis [Dupuytren]: Secondary | ICD-10-CM

## 2020-01-14 DIAGNOSIS — M1990 Unspecified osteoarthritis, unspecified site: Secondary | ICD-10-CM

## 2020-01-14 DIAGNOSIS — N200 Calculus of kidney: Secondary | ICD-10-CM

## 2020-01-14 DIAGNOSIS — R112 Nausea with vomiting, unspecified: Secondary | ICD-10-CM

## 2020-01-14 DIAGNOSIS — D869 Sarcoidosis, unspecified: Secondary | ICD-10-CM

## 2020-01-14 DIAGNOSIS — J329 Chronic sinusitis, unspecified: Secondary | ICD-10-CM

## 2020-01-14 DIAGNOSIS — M19049 Primary osteoarthritis, unspecified hand: Secondary | ICD-10-CM

## 2020-01-14 NOTE — Progress Notes
Subjective:       History of Present Illness  Alexandra Cobb is a 74 y.o. female.  2 months post right index finger trigger release, digital mucous cyst excision from right index and long fingers.  She is doing well.  She reports that her swelling has improved.  Her range of motion is improved.  She continues to have some DIP deformity.  Of note she will be moving to [facility].     Review of Systems   Constitutional: Negative.    HENT: Negative.    Eyes: Negative.    Respiratory: Negative.    Cardiovascular: Negative.    Gastrointestinal: Negative.    Endocrine: Negative.    Genitourinary: Negative.    Musculoskeletal: Negative.    Skin: Negative.    Allergic/Immunologic: Negative.    Neurological: Negative.    Hematological: Negative.    Psychiatric/Behavioral: Negative.          Objective:         ? acetylcarnitine HCl (ACETYL L-CARNITINE PO) Take 400 mg by mouth daily.   ? Alpha Lipoic Acid 200 mg tab Take 1 tablet by mouth daily.   ? anastrozole (ARIMIDEX) 1 mg tablet Take one tablet by mouth daily. Indications: hormone receptor positive breast cancer   ? ascorbic acid (VITAMIN C) 500 mg tablet Take 500 mg by mouth daily.   ? aspirin 325 mg tablet Take 325 mg by mouth daily. Take with food.   ? astaxanthin 4 mg cap Take 1 capsule by mouth daily.   ? atorvastatin (LIPITOR) 20 mg tablet Take 20 mg by mouth at bedtime daily.   ? bumetanide (BUMEX) 1 mg tablet Take 1 mg by mouth daily.   ? Calcium-Cholecalciferol (D3) (CALCIUM 600 + D(3)) 600 mg calcium- 200 unit cap Take 1 capsule by mouth twice daily.   ? cyanocobalamin 1,000 mcg tablet Take 1,000 mcg by mouth daily.   ? docusate (COLACE) 100 mg capsule Take 100 mg by mouth at bedtime daily.   ? fish oil /omega-3 fatty acids (SEA-OMEGA) 340/1000 mg capsule Take 1 capsule by mouth daily.   ? fluticasone (FLONASE) 50 mcg/actuation nasal spray Apply 1 spray to each nostril as directed at bedtime daily. Shake bottle gently before using.    ? gabapentin (NEURONTIN) 600 mg tablet Take 600 mg by mouth twice daily.   ? levothyroxine (SYNTHROID) 100 mcg tablet Take 100 mcg by mouth daily 30 minutes before breakfast.   ? lisinopriL (ZESTRIL) 5 mg tablet Take 5 mg by mouth daily.   ? loratadine (CLARITIN) 10 mg tablet Take 10 mg by mouth daily.   ? Lysine 1,000 mg tab Take 1 tablet by mouth daily.   ? melatonin 3 mg tab Take 6 mg by mouth at bedtime daily.   ? metFORMIN-XR(+) (GLUCOPHAGE XR) 500 mg extended release tablet Take 500 mg by mouth twice daily as needed.   ? Milk Thistle 175 mg tab Take 1 tablet by mouth daily.   ? oxybutynin XL (DITROPAN XL) 5 mg tablet Take 10 mg by mouth at bedtime daily.   ? pramipexole (MIRAPEX) 0.5 mg tablet Take 0.5 mg by mouth at bedtime daily.   ? selenium 200 mcg tab tablet Take 200 mcg by mouth at bedtime daily.   ? semaglutide (OZEMPIC) 0.25 mg or 0.5 mg(2 mg/1.5 mL) injection PEN Inject  under the skin every 7 days.   ? vitamin E 400 unit capsule Take 400 Units by mouth daily.   ?  vitamins, multiple cap Take 1 capsule by mouth daily.     Vitals:    01/14/20 1300   BP: 130/61   Pulse: 78   Weight: 113.4 kg (250 lb)   Height: 157.5 cm (62)   PainSc: Zero     Body mass index is 45.73 kg/m?Marland Kitchen     Physical Exam    Incisions intact.  Able to make a near complete fist.  There is very mild edema.  Sensations intact.  The dorsal index and long finger incisions have all healed.  There is no obvious mucous cyst recurrence.  She continues to have some osseous deformity which was present prior to surgery.     Assessment and Plan:  Doing well overall.  Continue range of motion therapy.  She can follow-up with me on a as needed basis.  She is happy with the plan.  All of her questions were answered.

## 2020-01-29 ENCOUNTER — Encounter: Admit: 2020-01-29 | Discharge: 2020-01-29 | Payer: BC Managed Care – PPO

## 2020-02-02 ENCOUNTER — Encounter: Admit: 2020-02-02 | Discharge: 2020-02-02 | Payer: BC Managed Care – PPO

## 2020-02-03 ENCOUNTER — Encounter: Admit: 2020-02-03 | Discharge: 2020-02-03 | Payer: BC Managed Care – PPO

## 2020-03-09 ENCOUNTER — Encounter: Admit: 2020-03-09 | Discharge: 2020-03-09 | Payer: BC Managed Care – PPO

## 2020-03-09 MED ORDER — ANASTROZOLE 1 MG PO TAB
1 mg | ORAL_TABLET | Freq: Every day | ORAL | 3 refills | 33.00000 days | Status: AC
Start: 2020-03-09 — End: ?

## 2020-03-26 ENCOUNTER — Encounter: Admit: 2020-03-26 | Discharge: 2020-03-26 | Payer: BC Managed Care – PPO

## 2020-03-26 DIAGNOSIS — C50412 Malignant neoplasm of upper-outer quadrant of left female breast: Secondary | ICD-10-CM

## 2020-03-26 NOTE — Telephone Encounter
Last OV  Note, referral and demographis and contact information for [facility] medical records   Faxed to  [facility]  Fax [phone]

## 2022-03-28 ENCOUNTER — Encounter: Admit: 2022-03-28 | Discharge: 2022-03-28 | Payer: BC Managed Care – PPO

## 2022-04-12 ENCOUNTER — Encounter: Admit: 2022-04-12 | Discharge: 2022-04-12 | Payer: BC Managed Care – PPO

## 2022-04-13 ENCOUNTER — Encounter: Admit: 2022-04-13 | Discharge: 2022-04-13 | Payer: BC Managed Care – PPO

## 2023-08-09 ENCOUNTER — Encounter: Admit: 2023-08-09 | Discharge: 2023-08-09 | Payer: BC Managed Care – PPO

## 2023-08-13 ENCOUNTER — Encounter: Admit: 2023-08-13 | Discharge: 2023-08-13 | Payer: BC Managed Care – PPO

## 2023-08-23 ENCOUNTER — Encounter: Admit: 2023-08-23 | Discharge: 2023-08-23 | Payer: BC Managed Care – PPO

## 2024-07-29 ENCOUNTER — Encounter: Admit: 2024-07-29 | Discharge: 2024-07-29 | Payer: BLUE CROSS/BLUE SHIELD

## 2024-07-29 NOTE — Telephone Encounter [36]
 External records pending, documenting communication for medical records to attach records to chart. Please allow 24-48 hours for records if pending for navigation.

## 2024-08-05 ENCOUNTER — Encounter: Admit: 2024-08-05 | Discharge: 2024-08-05 | Payer: BLUE CROSS/BLUE SHIELD
